# Patient Record
Sex: Female | Born: 1950 | Race: White | Hispanic: No | Marital: Married | Smoking: Never smoker
Health system: Southern US, Community
[De-identification: ages and names within clinical notes are randomized; demographics above are authoritative.]

## PROBLEM LIST (undated history)

## (undated) DIAGNOSIS — M1990 Unspecified osteoarthritis, unspecified site: Secondary | ICD-10-CM

## (undated) DIAGNOSIS — I011 Acute rheumatic endocarditis: Secondary | ICD-10-CM

## (undated) DIAGNOSIS — C801 Malignant (primary) neoplasm, unspecified: Secondary | ICD-10-CM

## (undated) DIAGNOSIS — E039 Hypothyroidism, unspecified: Secondary | ICD-10-CM

## (undated) HISTORY — PX: EYE SURGERY: SHX253

## (undated) HISTORY — PX: NO PAST SURGERIES: SHX2092

---

## 2019-12-01 ENCOUNTER — Other Ambulatory Visit: Payer: Self-pay | Admitting: Obstetrics and Gynecology

## 2019-12-02 ENCOUNTER — Other Ambulatory Visit: Payer: Self-pay | Admitting: Obstetrics and Gynecology

## 2019-12-02 DIAGNOSIS — Z803 Family history of malignant neoplasm of breast: Secondary | ICD-10-CM

## 2020-04-09 NOTE — H&P (Signed)
TOTAL KNEE ADMISSION H&P  Patient is being admitted for left total knee arthroplasty.  Subjective:  Chief Complaint:    Left knee OA / pain  HPI: Molly Wilson, 69 y.o. female, has a history of pain and functional disability in the left knee due to arthritis and has failed non-surgical conservative treatments for greater than 12 weeks to include NSAID's and/or analgesics, corticosteriod injections and activity modification.  Onset of symptoms was gradual, starting 3 years ago with gradually worsening course since that time. The patient noted no past surgery on the left knee(s).  Patient currently rates pain in the left knee(s) at 9 out of 10 with activity. Patient has night pain, worsening of pain with activity and weight bearing, pain that interferes with activities of daily living, pain with passive range of motion, crepitus and joint swelling.  Patient has evidence of periarticular osteophytes and joint space narrowing by imaging studies.  There is no active infection.  Risks, benefits and expectations were discussed with the patient.  Risks including but not limited to the risk of anesthesia, blood clots, nerve damage, blood vessel damage, failure of the prosthesis, infection and up to and including death.  Patient understand the risks, benefits and expectations and wishes to proceed with surgery.   PCP: Javier Glazier, MD  D/C Plans:       Home   Post-op Meds:       No Rx given  Tranexamic Acid:      To be given - IV   Decadron:      Is to be given  FYI:      ASA  Norco  DME:    Rx sent for - RW & 3-n-1  PT:   OPPT   Ph[facility]acy: [facility] -  [address], [place], [place]    Past Medical History:  Diagnosis Date   Arthritis    Cancer ([place])    colorectal cancer   Hypothyroidism    Rheumatoid aortitis     Past Surgical History:  Procedure Laterality Date   EYE SURGERY     cataract   NO PAST SURGERIES      No current facility-administered medications for this  encounter.   Current Outpatient Medications  Medication Sig Dispense Refill Last Dose   acetaminophen (TYLENOL) 500 MG tablet Take 500-1,000 mg by mouth every 6 (six) hours as needed (for pain.).      azelastine (ASTELIN) 0.1 % nasal spray Place 1 spray into both nostrils at bedtime. Use in each nostril as directed      Biotin 5000 MCG TABS Take 5,000 mcg by mouth daily.      Calcium Carbonate-Vit D-Min (CALCIUM 600+D3 PLUS MINERALS PO) Take 1 tablet by mouth daily with lunch.      Carboxymeth-Glycerin-Polysorb (REFRESH OPTIVE ADVANCED) 0.5-1-0.5 % SOLN Place 1 drop into both eyes daily.      diclofenac Sodium (VOLTAREN) 1 % GEL Apply 1 application topically 4 (four) times daily as needed (knee pain.).      Dietary Management Product (RHEUMATE) CAPS Take 1 capsule by mouth daily after lunch.       Fish Oil-Cholecalciferol (OMEGA-3 + VITAMIN D3 PO) Take 1 capsule by mouth daily.      fluticasone (FLONASE) 50 MCG/ACT nasal spray Place 2 sprays into both nostrils daily.       Ivermectin (SOOLANTRA) 1 % CREA Apply 1 application topically every other day as needed (skin irritation.).      Lactobacillus-Inulin (CULTURELLE DIGESTIVE DAILY  PO) Take 1 capsule by mouth daily.      levothyroxine (SYNTHROID) 100 MCG tablet Take 100 mcg by mouth daily before breakfast.      Menaquinone-7 (VITAMIN K2) 100 MCG CAPS Take 100 mcg by mouth daily.      Polyethyl Glycol-Propyl Glycol (SYSTANE) 0.4-0.3 % GEL ophthalmic gel Place 1 application into both eyes at bedtime.      predniSONE (DELTASONE) 1 MG tablet Take 2 mg by mouth daily. In the morning.      tretinoin (RETIN-A) 0.025 % cream Apply 1 application topically every other day as needed (skin irritation.). Alternate every other night with soolantra cream      No Known Allergies   Social History   Tobacco Use   Smoking status: Never Smoker   Smokeless tobacco: Never Used  Substance Use Topics   Alcohol use: Yes    Alcohol/week: 1.0 -  2.0 standard drink    Types: 1 - 2 Glasses of wine per week    Comment: week       Review of Systems  Constitutional: Negative.   HENT: Negative.   Eyes: Negative.   Respiratory: Negative.   Cardiovascular: Negative.   Gastrointestinal: Negative.   Genitourinary: Negative.   Musculoskeletal: Positive for joint pain.  Skin: Negative.   Neurological: Negative.   Endo/Heme/Allergies: Positive for environmental allergies.  Psychiatric/Behavioral: Negative.       Objective:  Physical Exam Constitutional:      Appearance: She is well-developed.  HENT:     Head: Normocephalic.  Eyes:     Pupils: Pupils are equal, round, and reactive to light.  Neck:     Thyroid: No thyromegaly.     Vascular: No JVD.     Trachea: No tracheal deviation.  Cardiovascular:     Rate and Rhythm: Normal rate and regular rhythm.  Pulmonary:     Effort: Pulmonary effort is normal. No respiratory distress.     Breath sounds: Normal breath sounds. No wheezing.  Abdominal:     Palpations: Abdomen is soft.     Tenderness: There is no abdominal tenderness. There is no guarding.  Musculoskeletal:     Cervical back: Neck supple.     Left knee: Swelling and bony tenderness present. No erythema or ecchymosis. Decreased range of motion. Tenderness present.  Lymphadenopathy:     Cervical: No cervical adenopathy.  Skin:    General: Skin is warm and dry.  Neurological:     Mental Status: She is alert and oriented to person, place, and time.       Imaging Review Plain radiographs demonstrate severe degenerative joint disease of the left knee.  The bone quality appears to be good for age and reported activity level.      Assessment/Plan:  End stage arthritis, left knee   The patient history, physical examination, clinical judgment of the provider and imaging studies are consistent with end stage degenerative joint disease of the left knee(s) and total knee arthroplasty is deemed medically  necessary. The treatment options including medical management, injection therapy arthroscopy and arthroplasty were discussed at length. The risks and benefits of total knee arthroplasty were presented and reviewed. The risks due to aseptic loosening, infection, stiffness, patella tracking problems, thromboembolic complications and other imponderables were discussed. The patient acknowledged the explanation, agreed to proceed with the plan and consent was signed. Patient is being admitted for inpatient treatment for surgery, pain control, PT, OT, prophylactic antibiotics, VTE prophylaxis, progressive ambulation and ADL's and discharge planning.  The patient is planning to be discharged home.     Patient's anticipated LOS is less than 2 midnights, meeting these requirements: - Lives within 1 hour of care - Has a competent adult at home to recover with post-op recover - NO history of  - Chronic pain requiring opiods  - Diabetes  - Coronary Artery Disease  - Heart failure  - Heart attack  - Stroke  - DVT/VTE  - Cardiac arrhythmia  - Respiratory Failure/COPD  - Renal failure  - Anemia  - Advanced Liver disease    [place] Pugh. Ilona Colley   PA-C  04/23/2020, 9:44 PM

## 2020-04-16 NOTE — Patient Instructions (Addendum)
DUE TO COVID-19 ONLY ONE VISITOR IS ALLOWED TO COME WITH YOU AND STAY IN THE WAITING ROOM ONLY DURING PRE OP AND PROCEDURE DAY OF SURGERY. THE 1 VISITOR MAY VISIT WITH YOU AFTER SURGERY IN YOUR PRIVATE ROOM DURING VISITING HOURS ONLY!  YOU NEED TO HAVE A COVID 19 TEST ON: 04/20/20 @ 2:50 pm , THIS TEST MUST BE DONE BEFORE SURGERY, COME  [address], [place] , [address].  ([place]) ONCE YOUR COVID TEST IS COMPLETED, PLEASE BEGIN THE QUARANTINE INSTRUCTIONS AS OUTLINED IN YOUR HANDOUT.                Ferrin Liebig   Your procedure is scheduled on: 04/24/20   Report to [facility] Main  Entrance   Report to admitting at : 7:30 AM     Call this number if you have problems the morning of surgery [phone]    Remember:   NO SOLID FOOD AFTER MIDNIGHT THE NIGHT PRIOR TO SURGERY. NOTHING BY MOUTH EXCEPT CLEAR LIQUIDS UNTIL: 7:00 am . PLEASE FINISH ENSURE DRINK PER SURGEON ORDER  WHICH NEEDS TO BE COMPLETED AT: 7:00 am.  CLEAR LIQUID DIET   Foods Allowed                                                                     Foods Excluded  Coffee and tea, regular and decaf                             liquids that you cannot  Plain Jell-O any favor except red or purple                                           see through such as: Fruit ices (not with fruit pulp)                                     milk, soups, orange juice  Iced Popsicles                                    All solid food Carbonated beverages, regular and diet                                    Cranberry, grape and apple juices Sports drinks like Gatorade Lightly seasoned clear broth or consume(fat free) Sugar, honey syrup  Sample Menu Breakfast                                Lunch                                     Supper Cranberry juice  Beef broth                            Chicken broth Jell-O                                     Grape juice                            Apple juice Coffee or tea                        Jell-O                                      Popsicle                                                Coffee or tea                        Coffee or tea  _____________________________________________________________________   BRUSH YOUR TEETH MORNING OF SURGERY AND RINSE YOUR MOUTH OUT, NO CHEWING GUM CANDY OR MINTS.     Take these medicines the morning of surgery with A SIP OF WATER: levothyroxine,prednisone.                                You may not have any metal on your body including hair pins and              piercings  Do not wear jewelry, make-up, lotions, powders or perfumes, deodorant             Do not wear nail polish on your fingernails.  Do not shave  48 hours prior to surgery.     Do not bring valuables to the hospital. [place].  Contacts, dentures or bridgework may not be worn into surgery.  Leave suitcase in the car. After surgery it may be brought to your room.     Patients discharged the day of surgery will not be allowed to drive home. IF YOU ARE HAVING SURGERY AND GOING HOME THE SAME DAY, YOU MUST HAVE AN ADULT TO DRIVE YOU HOME AND BE WITH YOU FOR 24 HOURS. YOU MAY GO HOME BY TAXI OR UBER OR ORTHERWISE, BUT AN ADULT MUST ACCOMPANY YOU HOME AND STAY WITH YOU FOR 24 HOURS.  Name and phone number of your driver:  Special Instructions: N/A              Please read over the following fact sheets you were given: _____________________________________________________________________         [facility] - Preparing for Surgery Before surgery, you can play an important role.  Because skin is not sterile, your skin needs to be as free of germs as possible.  You can reduce the number of germs on your skin by washing with CHG (chlorahexidine gluconate) soap before surgery.  CHG  is an antiseptic cleaner which kills germs and bonds with the skin to continue killing germs even after  washing. Please DO NOT use if you have an allergy to CHG or antibacterial soaps.  If your skin becomes reddened/irritated stop using the CHG and inform your nurse when you arrive at Short Stay. Do not shave (including legs and underarms) for at least 48 hours prior to the first CHG shower.  You may shave your face/neck. Please follow these instructions carefully:  1.  Shower with CHG Soap the night before surgery and the  morning of Surgery.  2.  If you choose to wash your hair, wash your hair first as usual with your  normal  shampoo.  3.  After you shampoo, rinse your hair and body thoroughly to remove the  shampoo.                           4.  Use CHG as you would any other liquid soap.  You can apply chg directly  to the skin and wash                       Gently with a scrungie or clean washcloth.  5.  Apply the CHG Soap to your body ONLY FROM THE NECK DOWN.   Do not use on face/ open                           Wound or open sores. Avoid contact with eyes, ears mouth and genitals (private parts).                       Wash face,  Genitals (private parts) with your normal soap.             6.  Wash thoroughly, paying special attention to the area where your surgery  will be performed.  7.  Thoroughly rinse your body with warm water from the neck down.  8.  DO NOT shower/wash with your normal soap after using and rinsing off  the CHG Soap.                9.  Pat yourself dry with a clean towel.            10.  Wear clean pajamas.            11.  Place clean sheets on your bed the night of your first shower and do not  sleep with pets. Day of Surgery : Do not apply any lotions/deodorants the morning of surgery.  Please wear clean clothes to the hospital/surgery center.  FAILURE TO FOLLOW THESE INSTRUCTIONS MAY RESULT IN THE CANCELLATION OF YOUR SURGERY PATIENT SIGNATURE_________________________________  NURSE  SIGNATURE__________________________________  ________________________________________________________________________   Adam Phenix  An incentive spirometer is a tool that can help keep your lungs clear and active. This tool measures how well you are filling your lungs with each breath. Taking long deep breaths may help reverse or decrease the chance of developing breathing (pulmonary) problems (especially infection) following:  A long period of time when you are unable to move or be active. BEFORE THE PROCEDURE   If the spirometer includes an indicator to show your best effort, your nurse or respiratory therapist will set it to a desired goal.  If possible, sit up straight or lean slightly forward. Try not to slouch.  Hold the incentive spirometer in an upright position. INSTRUCTIONS FOR USE  1. Sit on the edge of your bed if possible, or sit up as far as you can in bed or on a chair. 2. Hold the incentive spirometer in an upright position. 3. Breathe out normally. 4. Place the mouthpiece in your mouth and seal your lips tightly around it. 5. Breathe in slowly and as deeply as possible, raising the piston or the ball toward the top of the column. 6. Hold your breath for 3-5 seconds or for as long as possible. Allow the piston or ball to fall to the bottom of the column. 7. Remove the mouthpiece from your mouth and breathe out normally. 8. Rest for a few seconds and repeat Steps 1 through 7 at least 10 times every 1-2 hours when you are awake. Take your time and take a few normal breaths between deep breaths. 9. The spirometer may include an indicator to show your best effort. Use the indicator as a goal to work toward during each repetition. 10. After each set of 10 deep breaths, practice coughing to be sure your lungs are clear. If you have an incision (the cut made at the time of surgery), support your incision when coughing by placing a pillow or rolled up towels firmly  against it. Once you are able to get out of bed, walk around indoors and cough well. You may stop using the incentive spirometer when instructed by your caregiver.  RISKS AND COMPLICATIONS  Take your time so you do not get dizzy or light-headed.  If you are in pain, you may need to take or ask for pain medication before doing incentive spirometry. It is harder to take a deep breath if you are having pain. AFTER USE  Rest and breathe slowly and easily.  It can be helpful to keep track of a log of your progress. Your caregiver can provide you with a simple table to help with this. If you are using the spirometer at home, follow these instructions: [place] IF:   You are having difficultly using the spirometer.  You have trouble using the spirometer as often as instructed.  Your pain medication is not giving enough relief while using the spirometer.  You develop fever of 100.5 F (38.1 C) or higher. SEEK IMMEDIATE MEDICAL CARE IF:   You cough up bloody sputum that had not been present before.  You develop fever of 102 F (38.9 C) or greater.  You develop worsening pain at or near the incision site. MAKE SURE YOU:   Understand these instructions.  Will watch your condition.  Will get help right away if you are not doing well or get worse. Document Released: 01/26/2007 Document Revised: 12/08/2011 Document Reviewed: 03/29/2007 [facility] Patient Information 2014 [place], [place].   ________________________________________________________________________

## 2020-04-17 ENCOUNTER — Other Ambulatory Visit: Payer: Self-pay

## 2020-04-17 ENCOUNTER — Encounter (HOSPITAL_COMMUNITY): Payer: Self-pay

## 2020-04-17 ENCOUNTER — Encounter (HOSPITAL_COMMUNITY)
Admission: RE | Admit: 2020-04-17 | Discharge: 2020-04-17 | Disposition: A | Discharge status: Home / Self Care | Payer: Medicare Other | Source: Ambulatory Visit | Attending: Orthopedic Surgery | Admitting: Orthopedic Surgery

## 2020-04-17 DIAGNOSIS — Z01812 Encounter for preprocedural laboratory examination: Secondary | ICD-10-CM | POA: Diagnosis present

## 2020-04-17 HISTORY — DX: Hypothyroidism, unspecified: E03.9

## 2020-04-17 HISTORY — DX: Malignant (primary) neoplasm, unspecified: C80.1

## 2020-04-17 HISTORY — DX: Unspecified osteoarthritis, unspecified site: M19.90

## 2020-04-17 HISTORY — DX: Acute rheumatic endocarditis: I01.1

## 2020-04-17 LAB — SURGICAL PCR SCREEN
MRSA, PCR: NEGATIVE
Staphylococcus aureus: NEGATIVE

## 2020-04-17 LAB — CBC
HCT: 41.8 % (ref 36.0–46.0)
Hemoglobin: 13.3 g/dL (ref 12.0–15.0)
MCH: 31.4 pg (ref 26.0–34.0)
MCHC: 31.8 g/dL (ref 30.0–36.0)
MCV: 98.8 fL (ref 80.0–100.0)
Platelets: 235 10*3/uL (ref 150–400)
RBC: 4.23 MIL/uL (ref 3.87–5.11)
RDW: 13.4 % (ref 11.5–15.5)
WBC: 7.2 10*3/uL (ref 4.0–10.5)
nRBC: 0 % (ref 0.0–0.2)

## 2020-04-17 NOTE — Progress Notes (Signed)
COVID Vaccine Completed:yes Date COVID Vaccine completed:10/2019 COVID vaccine manufacturer: [place]   PCP - Dr. Merilynn Finland Cardiologist - No  Chest x-ray -  EKG -  Stress Test -  ECHO -  Cardiac Cath -   Sleep Study -  CPAP -   Fasting Blood Sugar -  Checks Blood Sugar _____ times a day  Blood Thinner Instructions: Aspirin Instructions: Last Dose:  Anesthesia review:   Patient denies shortness of breath, fever, cough and chest pain at PAT appointment   Patient verbalized understanding of instructions that were given to them at the PAT appointment. Patient was also instructed that they will need to review over the PAT instructions again at home before surgery.

## 2020-04-19 ENCOUNTER — Other Ambulatory Visit (HOSPITAL_COMMUNITY): Payer: Self-pay

## 2020-04-20 ENCOUNTER — Other Ambulatory Visit (HOSPITAL_COMMUNITY)
Admission: RE | Admit: 2020-04-20 | Discharge: 2020-04-20 | Disposition: A | Discharge status: Home / Self Care | Payer: Medicare Other | Source: Ambulatory Visit | Attending: Orthopedic Surgery | Admitting: Orthopedic Surgery

## 2020-04-20 DIAGNOSIS — Z20822 Contact with and (suspected) exposure to covid-19: Secondary | ICD-10-CM | POA: Insufficient documentation

## 2020-04-20 DIAGNOSIS — Z01812 Encounter for preprocedural laboratory examination: Secondary | ICD-10-CM | POA: Diagnosis present

## 2020-04-20 LAB — SARS CORONAVIRUS 2 (TAT 6-24 HRS): SARS Coronavirus 2: NEGATIVE

## 2020-04-24 ENCOUNTER — Other Ambulatory Visit: Payer: Self-pay

## 2020-04-24 ENCOUNTER — Observation Stay (HOSPITAL_COMMUNITY)
Admission: RE | Admit: 2020-04-24 | Discharge: 2020-04-25 | Disposition: A | Discharge status: Home / Self Care | Payer: Medicare Other | Attending: Orthopedic Surgery | Admitting: Orthopedic Surgery

## 2020-04-24 ENCOUNTER — Encounter (HOSPITAL_COMMUNITY): Payer: Self-pay | Admitting: Orthopedic Surgery

## 2020-04-24 ENCOUNTER — Ambulatory Visit (HOSPITAL_COMMUNITY): Payer: Medicare Other | Admitting: Anesthesiology

## 2020-04-24 ENCOUNTER — Encounter (HOSPITAL_COMMUNITY)
Admission: RE | Disposition: A | Discharge status: Home / Self Care | Payer: Self-pay | Source: Home / Self Care | Attending: Orthopedic Surgery

## 2020-04-24 DIAGNOSIS — Z85038 Personal history of other malignant neoplasm of large intestine: Secondary | ICD-10-CM | POA: Insufficient documentation

## 2020-04-24 DIAGNOSIS — M1712 Unilateral primary osteoarthritis, left knee: Secondary | ICD-10-CM | POA: Diagnosis not present

## 2020-04-24 DIAGNOSIS — E039 Hypothyroidism, unspecified: Secondary | ICD-10-CM | POA: Diagnosis not present

## 2020-04-24 DIAGNOSIS — M25562 Pain in left knee: Secondary | ICD-10-CM | POA: Diagnosis present

## 2020-04-24 DIAGNOSIS — Z96652 Presence of left artificial knee joint: Secondary | ICD-10-CM

## 2020-04-24 DIAGNOSIS — J302 Other seasonal allergic rhinitis: Secondary | ICD-10-CM | POA: Diagnosis not present

## 2020-04-24 HISTORY — PX: TOTAL KNEE ARTHROPLASTY: SHX125

## 2020-04-24 LAB — TYPE AND SCREEN
ABO/RH(D): O POS
Antibody Screen: NEGATIVE

## 2020-04-24 LAB — ABO/RH: ABO/RH(D): O POS

## 2020-04-24 SURGERY — ARTHROPLASTY, KNEE, TOTAL
Anesthesia: Spinal | Site: Knee | Laterality: Left

## 2020-04-24 MED ORDER — PHENYLEPHRINE 40 MCG/ML (10ML) SYRINGE FOR IV PUSH (FOR BLOOD PRESSURE SUPPORT)
PREFILLED_SYRINGE | INTRAVENOUS | Status: DC | PRN
  Administered 2020-04-24 (×2): 80 ug via INTRAVENOUS

## 2020-04-24 MED ORDER — ACETAMINOPHEN 325 MG PO TABS: 325.0000 mg | ORAL_TABLET | Freq: Four times a day (QID) | ORAL | Status: DC | PRN

## 2020-04-24 MED ORDER — FENTANYL CITRATE (PF) 100 MCG/2ML IJ SOLN
50.0000 ug | Freq: Once | INTRAMUSCULAR | Status: AC
  Administered 2020-04-24: 50 ug via INTRAVENOUS
  Filled 2020-04-24: qty 2

## 2020-04-24 MED ORDER — PROPOFOL 1000 MG/100ML IV EMUL
INTRAVENOUS | Status: AC
  Filled 2020-04-24: qty 100

## 2020-04-24 MED ORDER — LIDOCAINE 2% (20 MG/ML) 5 ML SYRINGE
INTRAMUSCULAR | Status: DC | PRN
  Administered 2020-04-24: 50 mg via INTRAVENOUS

## 2020-04-24 MED ORDER — HYDROCODONE-ACETAMINOPHEN 5-325 MG PO TABS
1.0000 | ORAL_TABLET | ORAL | Status: DC | PRN
  Administered 2020-04-24: 1 via ORAL
  Filled 2020-04-24: qty 1

## 2020-04-24 MED ORDER — PROPOFOL 10 MG/ML IV BOLUS
INTRAVENOUS | Status: DC | PRN
  Administered 2020-04-24: 20 mg via INTRAVENOUS
  Administered 2020-04-24: 65 ug/kg/min via INTRAVENOUS

## 2020-04-24 MED ORDER — SODIUM CHLORIDE (PF) 0.9 % IJ SOLN
INTRAMUSCULAR | Status: DC | PRN
  Administered 2020-04-24: 30 mL

## 2020-04-24 MED ORDER — METOCLOPRAMIDE HCL 5 MG PO TABS: 5.0000 mg | ORAL_TABLET | Freq: Three times a day (TID) | ORAL | Status: DC | PRN

## 2020-04-24 MED ORDER — TRANEXAMIC ACID-NACL 1000-0.7 MG/100ML-% IV SOLN
1000.0000 mg | Freq: Once | INTRAVENOUS | Status: AC
  Administered 2020-04-24: 1000 mg via INTRAVENOUS
  Filled 2020-04-24: qty 100

## 2020-04-24 MED ORDER — MENTHOL 3 MG MT LOZG: 1.0000 | LOZENGE | OROMUCOSAL | Status: DC | PRN

## 2020-04-24 MED ORDER — DEXAMETHASONE SODIUM PHOSPHATE 10 MG/ML IJ SOLN
10.0000 mg | Freq: Once | INTRAMUSCULAR | Status: AC
  Administered 2020-04-25: 10 mg via INTRAVENOUS
  Filled 2020-04-24: qty 1

## 2020-04-24 MED ORDER — ONDANSETRON HCL 4 MG PO TABS
4.0000 mg | ORAL_TABLET | Freq: Four times a day (QID) | ORAL | Status: DC | PRN
  Administered 2020-04-24 – 2020-04-25 (×3): 4 mg via ORAL
  Filled 2020-04-24 (×3): qty 1

## 2020-04-24 MED ORDER — LACTATED RINGERS IV SOLN: INTRAVENOUS | Status: DC

## 2020-04-24 MED ORDER — ONDANSETRON HCL 4 MG/2ML IJ SOLN: 4.0000 mg | Freq: Four times a day (QID) | INTRAMUSCULAR | Status: DC | PRN

## 2020-04-24 MED ORDER — ONDANSETRON HCL 4 MG/2ML IJ SOLN
INTRAMUSCULAR | Status: DC | PRN
  Administered 2020-04-24: 4 mg via INTRAVENOUS

## 2020-04-24 MED ORDER — SODIUM CHLORIDE 0.9 % IV SOLN: INTRAVENOUS | Status: DC

## 2020-04-24 MED ORDER — ALUM & MAG HYDROXIDE-SIMETH 200-200-20 MG/5ML PO SUSP: 15.0000 mL | ORAL | Status: DC | PRN

## 2020-04-24 MED ORDER — LEVOTHYROXINE SODIUM 100 MCG PO TABS
100.0000 ug | ORAL_TABLET | Freq: Every day | ORAL | Status: DC
  Administered 2020-04-25: 100 ug via ORAL
  Filled 2020-04-24: qty 1

## 2020-04-24 MED ORDER — KETOROLAC TROMETHAMINE 30 MG/ML IJ SOLN
INTRAMUSCULAR | Status: AC
  Filled 2020-04-24: qty 1

## 2020-04-24 MED ORDER — CELECOXIB 200 MG PO CAPS
200.0000 mg | ORAL_CAPSULE | Freq: Two times a day (BID) | ORAL | Status: DC
  Administered 2020-04-24 – 2020-04-25 (×2): 200 mg via ORAL
  Filled 2020-04-24 (×2): qty 1

## 2020-04-24 MED ORDER — CEFAZOLIN SODIUM-DEXTROSE 2-4 GM/100ML-% IV SOLN
2.0000 g | Freq: Four times a day (QID) | INTRAVENOUS | Status: AC
  Administered 2020-04-24 (×2): 2 g via INTRAVENOUS
  Filled 2020-04-24 (×2): qty 100

## 2020-04-24 MED ORDER — MIDAZOLAM HCL 2 MG/2ML IJ SOLN
1.0000 mg | INTRAMUSCULAR | Status: DC
  Administered 2020-04-24: 2 mg via INTRAVENOUS
  Filled 2020-04-24: qty 2

## 2020-04-24 MED ORDER — POLYETHYLENE GLYCOL 3350 17 G PO PACK
17.0000 g | PACK | Freq: Two times a day (BID) | ORAL | Status: DC
  Administered 2020-04-25: 17 g via ORAL
  Filled 2020-04-24: qty 1

## 2020-04-24 MED ORDER — BUPIVACAINE-EPINEPHRINE (PF) 0.25% -1:200000 IJ SOLN
INTRAMUSCULAR | Status: DC | PRN
  Administered 2020-04-24: 30 mL

## 2020-04-24 MED ORDER — BUPIVACAINE IN DEXTROSE 0.75-8.25 % IT SOLN
INTRATHECAL | Status: DC | PRN
  Administered 2020-04-24: 1.6 mL via INTRATHECAL

## 2020-04-24 MED ORDER — METHOCARBAMOL 500 MG PO TABS
500.0000 mg | ORAL_TABLET | Freq: Four times a day (QID) | ORAL | Status: DC | PRN
  Administered 2020-04-24 – 2020-04-25 (×3): 500 mg via ORAL
  Filled 2020-04-24 (×3): qty 1

## 2020-04-24 MED ORDER — DOCUSATE SODIUM 100 MG PO CAPS
100.0000 mg | ORAL_CAPSULE | Freq: Two times a day (BID) | ORAL | Status: DC
  Administered 2020-04-24 – 2020-04-25 (×2): 100 mg via ORAL
  Filled 2020-04-24 (×2): qty 1

## 2020-04-24 MED ORDER — PREDNISONE 1 MG PO TABS
2.0000 mg | ORAL_TABLET | Freq: Every day | ORAL | Status: DC
  Administered 2020-04-25: 2 mg via ORAL
  Filled 2020-04-24: qty 2

## 2020-04-24 MED ORDER — HYDROCODONE-ACETAMINOPHEN 7.5-325 MG PO TABS
1.0000 | ORAL_TABLET | ORAL | Status: DC | PRN
  Administered 2020-04-24: 2 via ORAL
  Administered 2020-04-25 (×4): 1 via ORAL
  Filled 2020-04-24: qty 2
  Filled 2020-04-24 (×4): qty 1

## 2020-04-24 MED ORDER — CEFAZOLIN SODIUM-DEXTROSE 2-4 GM/100ML-% IV SOLN
2.0000 g | INTRAVENOUS | Status: AC
  Administered 2020-04-24: 2 g via INTRAVENOUS
  Filled 2020-04-24: qty 100

## 2020-04-24 MED ORDER — STERILE WATER FOR IRRIGATION IR SOLN
Status: DC | PRN
  Administered 2020-04-24: 1000 mL

## 2020-04-24 MED ORDER — HYDROMORPHONE HCL 1 MG/ML IJ SOLN: 0.5000 mg | INTRAMUSCULAR | Status: DC | PRN

## 2020-04-24 MED ORDER — FLUTICASONE PROPIONATE 50 MCG/ACT NA SUSP
2.0000 | Freq: Every day | NASAL | Status: DC
  Filled 2020-04-24: qty 16

## 2020-04-24 MED ORDER — CHLORHEXIDINE GLUCONATE 0.12 % MT SOLN
15.0000 mL | Freq: Once | OROMUCOSAL | Status: AC
  Administered 2020-04-24: 15 mL via OROMUCOSAL

## 2020-04-24 MED ORDER — PHENOL 1.4 % MT LIQD: 1.0000 | OROMUCOSAL | Status: DC | PRN

## 2020-04-24 MED ORDER — FERROUS SULFATE 325 (65 FE) MG PO TABS
325.0000 mg | ORAL_TABLET | Freq: Two times a day (BID) | ORAL | Status: DC
  Administered 2020-04-25: 325 mg via ORAL
  Filled 2020-04-24: qty 1

## 2020-04-24 MED ORDER — PROPOFOL 10 MG/ML IV BOLUS
INTRAVENOUS | Status: AC
  Filled 2020-04-24: qty 20

## 2020-04-24 MED ORDER — BISACODYL 10 MG RE SUPP: 10.0000 mg | Freq: Every day | RECTAL | Status: DC | PRN

## 2020-04-24 MED ORDER — SODIUM CHLORIDE (PF) 0.9 % IJ SOLN
INTRAMUSCULAR | Status: AC
  Filled 2020-04-24: qty 100

## 2020-04-24 MED ORDER — DEXAMETHASONE SODIUM PHOSPHATE 10 MG/ML IJ SOLN
INTRAMUSCULAR | Status: DC | PRN
Start: 2020-04-24 — End: 2020-04-24
  Administered 2020-04-24: 10 mg

## 2020-04-24 MED ORDER — OXYCODONE HCL 5 MG PO TABS: 5.0000 mg | ORAL_TABLET | Freq: Once | ORAL | Status: DC | PRN

## 2020-04-24 MED ORDER — ONDANSETRON HCL 4 MG/2ML IJ SOLN: 4.0000 mg | Freq: Once | INTRAMUSCULAR | Status: DC | PRN

## 2020-04-24 MED ORDER — PHENYLEPHRINE HCL-NACL 10-0.9 MG/250ML-% IV SOLN
INTRAVENOUS | Status: DC | PRN
Start: 2020-04-24 — End: 2020-04-24
  Administered 2020-04-24: 25 ug/min via INTRAVENOUS

## 2020-04-24 MED ORDER — POVIDONE-IODINE 10 % EX SWAB
2.0000 "application " | Freq: Once | CUTANEOUS | Status: AC
  Administered 2020-04-24: 2 via TOPICAL

## 2020-04-24 MED ORDER — METHOCARBAMOL 500 MG IVPB - SIMPLE MED
500.0000 mg | Freq: Four times a day (QID) | INTRAVENOUS | Status: DC | PRN
  Filled 2020-04-24: qty 50

## 2020-04-24 MED ORDER — 0.9 % SODIUM CHLORIDE (POUR BTL) OPTIME
TOPICAL | Status: DC | PRN
  Administered 2020-04-24: 1000 mL

## 2020-04-24 MED ORDER — DEXAMETHASONE SODIUM PHOSPHATE 10 MG/ML IJ SOLN
10.0000 mg | Freq: Once | INTRAMUSCULAR | Status: AC
  Administered 2020-04-24: 8 mg via INTRAVENOUS

## 2020-04-24 MED ORDER — AZELASTINE HCL 0.1 % NA SOLN
1.0000 | Freq: Every day | NASAL | Status: DC
  Administered 2020-04-24: 1 via NASAL
  Filled 2020-04-24: qty 30

## 2020-04-24 MED ORDER — OXYCODONE HCL 5 MG/5ML PO SOLN: 5.0000 mg | Freq: Once | ORAL | Status: DC | PRN

## 2020-04-24 MED ORDER — SODIUM CHLORIDE 0.9 % IR SOLN
Status: DC | PRN
  Administered 2020-04-24: 1000 mL

## 2020-04-24 MED ORDER — ROPIVACAINE HCL 7.5 MG/ML IJ SOLN
INTRAMUSCULAR | Status: DC | PRN
  Administered 2020-04-24: 20 mL via PERINEURAL

## 2020-04-24 MED ORDER — MAGNESIUM CITRATE PO SOLN: 1.0000 | Freq: Once | ORAL | Status: DC | PRN

## 2020-04-24 MED ORDER — METOCLOPRAMIDE HCL 5 MG/ML IJ SOLN: 5.0000 mg | Freq: Three times a day (TID) | INTRAMUSCULAR | Status: DC | PRN

## 2020-04-24 MED ORDER — BUPIVACAINE-EPINEPHRINE (PF) 0.25% -1:200000 IJ SOLN
INTRAMUSCULAR | Status: AC
  Filled 2020-04-24: qty 30

## 2020-04-24 MED ORDER — TRANEXAMIC ACID-NACL 1000-0.7 MG/100ML-% IV SOLN
1000.0000 mg | INTRAVENOUS | Status: AC
  Administered 2020-04-24: 1000 mg via INTRAVENOUS
  Filled 2020-04-24: qty 100

## 2020-04-24 MED ORDER — HYDROMORPHONE HCL 1 MG/ML IJ SOLN: 0.2500 mg | INTRAMUSCULAR | Status: DC | PRN

## 2020-04-24 MED ORDER — ASPIRIN 81 MG PO CHEW
81.0000 mg | CHEWABLE_TABLET | Freq: Two times a day (BID) | ORAL | Status: DC
  Administered 2020-04-24 – 2020-04-25 (×2): 81 mg via ORAL
  Filled 2020-04-24 (×2): qty 1

## 2020-04-24 MED ORDER — DIPHENHYDRAMINE HCL 12.5 MG/5ML PO ELIX: 12.5000 mg | ORAL_SOLUTION | ORAL | Status: DC | PRN

## 2020-04-24 MED ORDER — ORAL CARE MOUTH RINSE: 15.0000 mL | Freq: Once | OROMUCOSAL | Status: AC

## 2020-04-24 MED ORDER — KETOROLAC TROMETHAMINE 30 MG/ML IJ SOLN
INTRAMUSCULAR | Status: DC | PRN
  Administered 2020-04-24: 30 mg

## 2020-04-24 SURGICAL SUPPLY — 63 items
ATTUNE MED ANAT PAT 35 KNEE (Knees) ×2 IMPLANT
ATTUNE MED ANAT PAT 35MM KNEE (Knees) ×1 IMPLANT
ATTUNE PSFEM LTSZ5 NARCEM KNEE (Femur) ×3 IMPLANT
ATTUNE PSRP INSR SZ5 6 KNEE (Insert) ×2 IMPLANT
ATTUNE PSRP INSR SZ5 6MM KNEE (Insert) ×1 IMPLANT
BAG ZIPLOCK 12X15 (MISCELLANEOUS) IMPLANT
BASEPLATE TIBIAL ROTATING SZ 4 (Knees) ×3 IMPLANT
BLADE SAW SGTL 11.0X1.19X90.0M (BLADE) IMPLANT
BLADE SAW SGTL 13.0X1.19X90.0M (BLADE) ×3 IMPLANT
BLADE SURG SZ10 CARB STEEL (BLADE) ×6 IMPLANT
BNDG ELASTIC 6X10 VLCR STRL LF (GAUZE/BANDAGES/DRESSINGS) ×3 IMPLANT
BNDG ELASTIC 6X5.8 VLCR STR LF (GAUZE/BANDAGES/DRESSINGS) ×3 IMPLANT
BOWL SMART MIX CTS (DISPOSABLE) ×3 IMPLANT
CEMENT HV SMART SET (Cement) ×6 IMPLANT
COVER SURGICAL LIGHT HANDLE (MISCELLANEOUS) ×3 IMPLANT
COVER WAND RF STERILE (DRAPES) IMPLANT
CUFF TOURN SGL QUICK 34 (TOURNIQUET CUFF) ×2
CUFF TRNQT CYL 34X4.125X (TOURNIQUET CUFF) ×1 IMPLANT
DECANTER SPIKE VIAL GLASS SM (MISCELLANEOUS) ×6 IMPLANT
DERMABOND ADVANCED (GAUZE/BANDAGES/DRESSINGS) ×2
DERMABOND ADVANCED .7 DNX12 (GAUZE/BANDAGES/DRESSINGS) ×1 IMPLANT
DRAPE U-SHAPE 47X51 STRL (DRAPES) ×3 IMPLANT
DRESSING AQUACEL AG SP 3.5X10 (GAUZE/BANDAGES/DRESSINGS) ×1 IMPLANT
DRSG AQUACEL AG ADV 3.5X10 (GAUZE/BANDAGES/DRESSINGS) ×3 IMPLANT
DRSG AQUACEL AG SP 3.5X10 (GAUZE/BANDAGES/DRESSINGS) ×3
DURAPREP 26ML APPLICATOR (WOUND CARE) ×6 IMPLANT
ELECT REM PT RETURN 15FT ADLT (MISCELLANEOUS) ×3 IMPLANT
GLOVE BIO SURGEON STRL SZ 6 (GLOVE) ×3 IMPLANT
GLOVE BIOGEL PI IND STRL 6.5 (GLOVE) ×1 IMPLANT
GLOVE BIOGEL PI IND STRL 7.5 (GLOVE) ×1 IMPLANT
GLOVE BIOGEL PI IND STRL 8.5 (GLOVE) ×1 IMPLANT
GLOVE BIOGEL PI INDICATOR 6.5 (GLOVE) ×2
GLOVE BIOGEL PI INDICATOR 7.5 (GLOVE) ×2
GLOVE BIOGEL PI INDICATOR 8.5 (GLOVE) ×2
GLOVE ECLIPSE 8.0 STRL XLNG CF (GLOVE) ×3 IMPLANT
GLOVE ORTHO TXT STRL SZ7.5 (GLOVE) ×3 IMPLANT
GOWN STRL REUS W/ TWL LRG LVL3 (GOWN DISPOSABLE) ×1 IMPLANT
GOWN STRL REUS W/TWL 2XL LVL3 (GOWN DISPOSABLE) ×3 IMPLANT
GOWN STRL REUS W/TWL LRG LVL3 (GOWN DISPOSABLE) ×5 IMPLANT
HANDPIECE INTERPULSE COAX TIP (DISPOSABLE) ×2
HOLDER FOLEY CATH W/STRAP (MISCELLANEOUS) IMPLANT
KIT TURNOVER KIT A (KITS) IMPLANT
MANIFOLD NEPTUNE II (INSTRUMENTS) ×3 IMPLANT
NDL SAFETY ECLIPSE 18X1.5 (NEEDLE) IMPLANT
NEEDLE HYPO 18GX1.5 SHARP (NEEDLE)
NS IRRIG 1000ML POUR BTL (IV SOLUTION) ×3 IMPLANT
PACK TOTAL KNEE CUSTOM (KITS) ×3 IMPLANT
PENCIL SMOKE EVACUATOR (MISCELLANEOUS) IMPLANT
PIN DRILL FIX HALF THREAD (BIT) ×3 IMPLANT
PIN FIX SIGMA LCS THRD HI (PIN) ×3 IMPLANT
PROTECTOR NERVE ULNAR (MISCELLANEOUS) ×3 IMPLANT
SET HNDPC FAN SPRY TIP SCT (DISPOSABLE) ×1 IMPLANT
SET PAD KNEE POSITIONER (MISCELLANEOUS) ×3 IMPLANT
SUT MNCRL AB 4-0 PS2 18 (SUTURE) ×3 IMPLANT
SUT STRATAFIX PDS+ 0 24IN (SUTURE) ×3 IMPLANT
SUT VIC AB 1 CT1 36 (SUTURE) ×3 IMPLANT
SUT VIC AB 2-0 CT1 27 (SUTURE) ×6
SUT VIC AB 2-0 CT1 TAPERPNT 27 (SUTURE) ×3 IMPLANT
SYR 3ML LL SCALE MARK (SYRINGE) ×3 IMPLANT
TRAY FOLEY MTR SLVR 16FR STAT (SET/KITS/TRAYS/PACK) ×3 IMPLANT
WATER STERILE IRR 1000ML POUR (IV SOLUTION) ×6 IMPLANT
WRAP KNEE MAXI GEL POST OP (GAUZE/BANDAGES/DRESSINGS) ×3 IMPLANT
YANKAUER SUCT BULB TIP 10FT TU (MISCELLANEOUS) ×3 IMPLANT

## 2020-04-24 NOTE — Anesthesia Procedure Notes (Signed)
Spinal  Patient location during procedure: OR Start time: 04/24/2020 10:03 AM End time: 04/24/2020 10:07 AM Staffing Performed: anesthesiologist  Anesthesiologist: Josephine Igo, MD Preanesthetic Checklist Completed: patient identified, IV checked, site marked, risks and benefits discussed, surgical consent, monitors and equipment checked, pre-op evaluation and timeout performed Spinal Block Patient position: sitting Prep: DuraPrep and site prepped and draped Patient monitoring: heart rate, cardiac monitor, continuous pulse ox and blood pressure Approach: midline Location: L3-4 Injection technique: single-shot Needle Needle type: Pencan  Needle gauge: 24 G Needle length: 9 cm Needle insertion depth: 5 cm Assessment Sensory level: T4 Additional Notes Patient tolerated procedure well. Adequate sensory level.

## 2020-04-24 NOTE — Op Note (Signed)
NAME:  Molly Wilson RECORD NO.:  [id number]                             FACILITY:  [facility]      PHYSICIAN:  Pietro Cassis. Alvan Dame, M.D.  DATE OF BIRTH:  1951/08/11      DATE OF PROCEDURE:  04/24/2020                                     OPERATIVE REPORT         PREOPERATIVE DIAGNOSIS:  Left knee osteoarthritis.      POSTOPERATIVE DIAGNOSIS:  Left knee osteoarthritis.      FINDINGS:  The patient was noted to have complete loss of cartilage and   bone-on-bone arthritis with associated osteophytes in the medial and patellofemoral compartments of   the knee with a significant synovitis and associated effusion.  The patient had failed months of conservative treatment including medications, injection therapy, activity modification.     PROCEDURE:  Left total knee replacement.      COMPONENTS USED:  DePuy Attune rotating platform posterior stabilized knee   system, a size 5N femur, 4 tibia, size 6 mm PS AOX insert, and 35 anatomic patellar   button.      SURGEON:  Pietro Cassis. Alvan Dame, M.D.      ASSISTANT:  Griffith Citron, PA-C.      ANESTHESIA:  Regional and Spinal.      SPECIMENS:  None.      COMPLICATION:  None.      DRAINS:  None.  EBL: <100 cc      TOURNIQUET TIME:   Total Tourniquet Time Documented: Thigh (Left) - 31 minutes Total: Thigh (Left) - 31 minutes  .      The patient was stable to the recovery room.      INDICATION FOR PROCEDURE:  Molly Wilson is a 69 y.o. female patient of   mine.  The patient had been seen, evaluated, and treated for months conservatively in the   office with medication, activity modification, and injections.  The patient had   radiographic changes of bone-on-bone arthritis with endplate sclerosis and osteophytes noted.  Based on the radiographic changes and failed conservative measures, the patient   decided to proceed with definitive treatment, total knee replacement.  Risks of infection, DVT, component failure, need  for revision surgery, neurovascular injury were reviewed in the office setting.  The postop course was reviewed stressing the efforts to maximize post-operative satisfaction and function.  Consent was obtained for benefit of pain   relief.      PROCEDURE IN DETAIL:  The patient was brought to the operative theater.   Once adequate anesthesia, preoperative antibiotics, 2 gm of Ancef,1 gm of Tranexamic Acid, and 10 mg of Decadron administered, the patient was positioned supine with a left thigh tourniquet placed.  The  left lower extremity was prepped and draped in sterile fashion.  A time-   out was performed identifying the patient, planned procedure, and the appropriate extremity.      The left lower extremity was placed in the [facility] leg holder.  The leg was   exsanguinated, tourniquet elevated to 250 mmHg.  A midline incision was  made followed by median parapatellar arthrotomy.  Following initial   exposure, attention was first directed to the patella.  Precut   measurement was noted to be 21 mm.  I resected down to 14 mm and used a   35 anatomic patellar button to restore patellar height as well as cover the cut surface.      The lug holes were drilled and a metal shim was placed to protect the   patella from retractors and saw blade during the procedure.      At this point, attention was now directed to the femur.  The femoral   canal was opened with a drill, irrigated to try to prevent fat emboli.  An   intramedullary rod was passed at 3 degrees valgus, 9 mm of bone was   resected off the distal femur.  Following this resection, the tibia was   subluxated anteriorly.  Using the extramedullary guide, 2 mm of bone was resected off   the proximal medial tibia.  We confirmed the gap would be   stable medially and laterally with a size 5 spacer block as well as confirmed that the tibial cut was perpendicular in the coronal plane, checking with an alignment rod.      Once this was done, I  sized the femur to be a size 5 in the anterior-   posterior dimension, chose a narrow component based on medial and   lateral dimension.  The size 5 rotation block was then pinned in   position anterior referenced using the C-clamp to set rotation.  The   anterior, posterior, and  chamfer cuts were made without difficulty nor   notching making certain that I was along the anterior cortex to help   with flexion gap stability.      The final box cut was made off the lateral aspect of distal femur.      At this point, the tibia was sized to be a size 4.  The size 4 tray was   then pinned in position through the medial third of the tubercle,   drilled, and keel punched.  Trial reduction was now carried with a 5 femur,  4 tibia, a size 6 mm PS insert, and the 35 anatomic patella botton.  The knee was brought to full extension with good flexion stability with the patella   tracking through the trochlea without application of pressure.  Given   all these findings the trial components removed.  Final components were   opened and cement was mixed.  The knee was irrigated with normal saline solution and pulse lavage.  The synovial lining was   then injected with 30 cc of 0.25% Marcaine with epinephrine, 1 cc of Toradol and 30 cc of NS for a total of 61 cc.     Final implants were then cemented onto cleaned and dried cut surfaces of bone with the knee brought to extension with a size 6 mm PS trial insert.      Once the cement had fully cured, excess cement was removed   throughout the knee.  I confirmed that I was satisfied with the range of   motion and stability, and the final size 6 mm PS AOX insert was chosen.  It was   placed into the knee.      The tourniquet had been let down at 31 minutes.  No significant   hemostasis was required.  The extensor mechanism was then reapproximated using #1 Vicryl  and #1 Stratafix sutures with the knee   in flexion.  The   remaining wound was closed with 2-0  Vicryl and running 4-0 Monocryl.   The knee was cleaned, dried, dressed sterilely using Dermabond and   Aquacel dressing.  The patient was then   brought to recovery room in stable condition, tolerating the procedure   well.   Please note that Physician Assistant, Griffith Citron, PA-C was present for the entirety of the case, and was utilized for pre-operative positioning, peri-operative retractor management, general facilitation of the procedure and for primary wound closure at the end of the case.              Pietro Cassis Alvan Dame, M.D.    04/24/2020 11:24 AM

## 2020-04-24 NOTE — Progress Notes (Signed)
AssistedDr. Foster with left, ultrasound guided, adductor canal block. Side rails up, monitors on throughout procedure. See vital signs in flow sheet. Tolerated Procedure well.  

## 2020-04-24 NOTE — Interval H&P Note (Signed)
History and Physical Interval Note:  04/24/2020 8:41 AM  Molly Wilson  has presented today for surgery, with the diagnosis of Left knee osteoarthritis.  The various methods of treatment have been discussed with the patient and family. After consideration of risks, benefits and other options for treatment, the patient has consented to  Procedure(s) with comments: TOTAL KNEE ARTHROPLASTY (Left) - 70 mins as a surgical intervention.  The patient's history has been reviewed, patient examined, no change in status, stable for surgery.  I have reviewed the patient's chart and labs.  Questions were answered to the patient's satisfaction.     Mauri Pole

## 2020-04-24 NOTE — Anesthesia Preprocedure Evaluation (Signed)
Anesthesia Evaluation  Patient identified by MRN, date of birth, ID band Patient awake    Reviewed: Allergy & Precautions, NPO status , Patient's Chart, lab work & pertinent test results  Airway Mallampati: II  TM Distance: >3 FB Neck ROM: Full    Dental  (+) Teeth Intact, Caps, Dental Advisory Given   Pulmonary neg pulmonary ROS,    Pulmonary exam normal breath sounds clear to auscultation       Cardiovascular Normal cardiovascular exam Rhythm:Regular Rate:Normal  Hx/o Rheumatoid aortitis   Neuro/Psych negative neurological ROS  negative psych ROS   GI/Hepatic negative GI ROS, Neg liver ROS,   Endo/Other  Hypothyroidism   Renal/GU   negative genitourinary   Musculoskeletal  (+) Arthritis , Osteoarthritis,  OA left knee   Abdominal   Peds  Hematology negative hematology ROS (+)   Anesthesia Other Findings   Reproductive/Obstetrics negative OB ROS                             Anesthesia Physical Anesthesia Plan  ASA: II  Anesthesia Plan: Spinal   Post-op Pain Management:  Regional for Post-op pain   Induction: Intravenous  PONV Risk Score and Plan: 3 and Ondansetron, Dexamethasone, Treatment may vary due to age or medical condition and Propofol infusion  Airway Management Planned: Natural Airway and Simple Face Mask  Additional Equipment:   Intra-op Plan:   Post-operative Plan:   Informed Consent: I have reviewed the patients History and Physical, chart, labs and discussed the procedure including the risks, benefits and alternatives for the proposed anesthesia with the patient or authorized representative who has indicated his/her understanding and acceptance.     Dental advisory given  Plan Discussed with: CRNA, Anesthesiologist and Surgeon  Anesthesia Plan Comments:         Anesthesia Quick Evaluation

## 2020-04-24 NOTE — Anesthesia Procedure Notes (Signed)
Anesthesia Regional Block: Adductor canal block   Pre-Anesthetic Checklist: ,, timeout performed, Correct Patient, Correct Site, Correct Laterality, Correct Procedure, Correct Position, site marked, Risks and benefits discussed,  Surgical consent,  Pre-op evaluation,  At surgeon's request and post-op pain management  Laterality: Left  Prep: chloraprep       Needles:  Injection technique: Single-shot  Needle Type: Echogenic Stimulator Needle     Needle Length: 9cm  Needle Gauge: 21   Needle insertion depth: 7 cm   Additional Needles:   Procedures:,,,, ultrasound used (permanent image in chart),,,,  Narrative:  Start time: 04/24/2020 9:15 AM End time: 04/24/2020 9:19 AM Injection made incrementally with aspirations every 5 mL.  Performed by: Personally  Anesthesiologist: Josephine Igo, MD  Additional Notes: Timeout performed. Patient sedated. Relevant anatomy ID'd using [place]. Incremental 2-58ml injection of LA with frequent aspiration. Patient tolerated procedure well.        Left Adductor Canal Block

## 2020-04-24 NOTE — Transfer of Care (Signed)
Immediate Anesthesia Transfer of Care Note  Patient: Molly Wilson  Procedure(s) Performed: Procedure(s) with comments: TOTAL KNEE ARTHROPLASTY (Left) - 70 mins  Patient Location: PACU  Anesthesia Type:Spinal  Level of Consciousness:  sedated, patient cooperative and responds to stimulation  Airway & Oxygen Therapy:Patient Spontanous Breathing and Patient connected to face mask oxgen  Post-op Assessment:  Report given to PACU RN and Post -op Vital signs reviewed and stable  Post vital signs:  Reviewed and stable  Last Vitals:  Vitals:   04/24/20 0919 04/24/20 0920  BP: 126/82   Pulse: 72 71  Resp: 16 16  Temp:    SpO2: 59% 16%    Complications: No apparent anesthesia complications

## 2020-04-24 NOTE — Plan of Care (Signed)
  Problem: Clinical Measurements: Goal: Postoperative complications will be avoided or minimized Outcome: Progressing   Problem: Pain Management: Goal: Pain level will decrease with appropriate interventions Outcome: Progressing   Problem: Skin Integrity: Goal: Will show signs of wound healing Outcome: Progressing   Problem: Activity: Goal: Ability to avoid complications of mobility impairment will improve Outcome: Progressing   Problem: Clinical Measurements: Goal: Will remain free from infection Outcome: Progressing   Problem: Nutrition: Goal: Adequate nutrition will be maintained Outcome: Progressing   Problem: Coping: Goal: Level of anxiety will decrease Outcome: Progressing

## 2020-04-24 NOTE — Discharge Instructions (Signed)

## 2020-04-24 NOTE — Anesthesia Postprocedure Evaluation (Signed)
Anesthesia Post Note  Patient: Molly Wilson  Procedure(s) Performed: TOTAL KNEE ARTHROPLASTY (Left Knee)     Patient location during evaluation: PACU Anesthesia Type: Spinal Level of consciousness: oriented and awake and alert Pain management: pain level controlled Vital Signs Assessment: post-procedure vital signs reviewed and stable Respiratory status: spontaneous breathing, respiratory function stable and nonlabored ventilation Cardiovascular status: blood pressure returned to baseline and stable Postop Assessment: no headache, no backache, no apparent nausea or vomiting, spinal receding and patient able to bend at knees Anesthetic complications: no   No complications documented.  Last Vitals:  Vitals:   04/24/20 1300 04/24/20 1315  BP: 112/76 119/79  Pulse: 60 58  Resp: 20 13  Temp:  36.9 C  SpO2: 100% 100%    Last Pain:  Vitals:   04/24/20 1315  TempSrc:   PainSc: 0-No pain                 Shirley Decamp A.

## 2020-04-24 NOTE — Evaluation (Signed)
Physical Therapy Evaluation Patient Details Name: Molly Wilson MRN: [id number] DOB: 27-Dec-1950 Today's Date: 04/24/2020   History of Present Illness  Patient is 69 y.o. female s/p Lt TKA on 04/24/20 with PMH significant for RA, OA, colorectal cancer, hypothyroidism.  Clinical Impression  IVAN MASKELL is a 69 y.o. female POD 0 s/p Lt TKA. Patient reports independence with mobility at baseline. Patient is now limited by functional impairments (see PT problem list below) and requires min assist for transfers and gait with RW. Patient was able to ambulate ~40 feet with RW and min assist. Patient instructed in exercise to facilitate ROM and circulation. Patient will benefit from continued skilled PT interventions to address impairments and progress towards PLOF. Acute PT will follow to progress mobility and stair training in preparation for safe discharge home.     Follow Up Recommendations Follow surgeon's recommendation for DC plan and follow-up therapies;Home health PT;Outpatient PT (PT at [facility])    Equipment Recommendations  Rolling walker with 5" wheels;3in1 (PT)    Recommendations for Other Services       Precautions / Restrictions Precautions Precautions: Fall Restrictions Weight Bearing Restrictions: No      Mobility  Bed Mobility Overal bed mobility: Needs Assistance Bed Mobility: Supine to Sit     Supine to sit: Min assist;HOB elevated     General bed mobility comments: cues to use bed rail, light assist to raise trunk fully  Transfers Overall transfer level: Needs assistance Equipment used: Rolling walker (2 wheeled) Transfers: Sit to/from Stand Sit to Stand: Min assist         General transfer comment: VC's for safe technique with RW, assist for power up. pt steady with rising.  Ambulation/Gait Ambulation/Gait assistance: Min assist;Min guard Gait Distance (Feet): 40 Feet Assistive device: Rolling walker (2 wheeled) Gait Pattern/deviations: Step-to  pattern;Decreased stride length;Decreased weight shift to left Gait velocity: decreased   General Gait Details: VC's for safe step pattern and proximity to RW, no overt LOB noted.   Stairs       Wheelchair Mobility    Modified Rankin (Stroke Patients Only)       Balance Overall balance assessment: Needs assistance Sitting-balance support: Feet supported Sitting balance-Leahy Scale: Good     Standing balance support: During functional activity;Bilateral upper extremity supported Standing balance-Leahy Scale: Fair              Pertinent Vitals/Pain Pain Assessment: 0-10 Pain Score: 4  Pain Location: Lt knee Pain Descriptors / Indicators: Burning;Aching;Discomfort Pain Intervention(s): Limited activity within patient's tolerance;Monitored during session;Repositioned;Ice applied    Home Living Family/patient expects to be discharged to:: Private residence Living Arrangements: Spouse/significant other Available Help at Discharge: Family Type of Home: House Home Access: Level entry     Home Layout: One [place]: [place] - single point;Grab bars - tub/shower Additional Comments: lives [facility] [address] in [place].     Prior Function Level of Independence: Independent               Hand Dominance   Dominant Hand: Right    Extremity/Trunk Assessment   Upper Extremity Assessment Upper Extremity Assessment: Overall WFL for tasks assessed    Lower Extremity Assessment Lower Extremity Assessment: Overall WFL for tasks assessed;LLE deficits/detail LLE Deficits / Details: good quad activation, no extensor lag with SLR LLE Sensation: WNL LLE Coordination: WNL    Cervical / Trunk Assessment Cervical / Trunk Assessment: Normal  Communication   Communication: No difficulties  Cognition Arousal/Alertness:  Awake/alert Behavior During Therapy: WFL for tasks assessed/performed Overall Cognitive Status: Within Functional Limits for tasks assessed                    General Comments      Exercises Total Joint Exercises Ankle Circles/Pumps: Both;AROM;10 reps;Seated Quad Sets: Left;5 reps;Seated;AROM Heel Slides: AROM;Left;5 reps;Seated   Assessment/Plan    PT Assessment Patient needs continued PT services  PT Problem List Decreased strength;Decreased range of motion;Decreased activity tolerance;Decreased balance;Decreased mobility;Decreased knowledge of use of DME       PT Treatment Interventions DME instruction;Gait training;Stair training;Therapeutic activities;Functional mobility training;Therapeutic exercise;Balance training;Patient/family education    PT Goals (Current goals can be found in the Care Plan section)  Acute Rehab PT Goals Patient Stated Goal: get back to walking her dogs PT Goal Formulation: With patient Time For Goal Achievement: 05/01/20 Potential to Achieve Goals: Good    Frequency 7X/week        AM-PAC PT "6 Clicks" Mobility  Outcome Measure Help needed turning from your back to your side while in a flat bed without using bedrails?: A Little Help needed moving from lying on your back to sitting on the side of a flat bed without using bedrails?: A Little Help needed moving to and from a bed to a chair (including a wheelchair)?: A Little Help needed standing up from a chair using your arms (e.g., wheelchair or bedside chair)?: A Little Help needed to walk in hospital room?: A Little Help needed climbing 3-5 steps with a railing? : A Little 6 Click Score: 18    End of Session Equipment Utilized During Treatment: Gait belt Activity Tolerance: Patient tolerated treatment well Patient left: in chair;with call bell/phone within reach;with chair alarm set;with family/visitor present;with nursing/sitter in room Nurse Communication: Mobility status;Patient requests pain meds PT Visit Diagnosis: Muscle weakness (generalized) (M62.81);Difficulty in walking, not elsewhere classified (R26.2)    Time:  4196-2229 PT Time Calculation (min) (ACUTE ONLY): 23 min   Charges:   PT Evaluation $PT Eval Low Complexity: 1 Low PT Treatments $Gait Training: 8-22 mins        Verner Mould, DPT Acute Rehabilitation Services  Office [phone] Pager [phone]  04/24/2020 4:32 PM

## 2020-04-25 DIAGNOSIS — M1712 Unilateral primary osteoarthritis, left knee: Secondary | ICD-10-CM | POA: Diagnosis not present

## 2020-04-25 LAB — BASIC METABOLIC PANEL
Anion gap: 7 (ref 5–15)
BUN: 15 mg/dL (ref 8–23)
CO2: 24 mmol/L (ref 22–32)
Calcium: 8.5 mg/dL — ABNORMAL LOW (ref 8.9–10.3)
Chloride: 107 mmol/L (ref 98–111)
Creatinine, Ser: 0.59 mg/dL (ref 0.44–1.00)
GFR calc Af Amer: >60 mL/min (ref >60)
GFR calc non Af Amer: >60 mL/min (ref >60)
Glucose, Bld: 174 mg/dL — ABNORMAL HIGH (ref 70–99)
Potassium: 3.8 mmol/L (ref 3.5–5.1)
Sodium: 138 mmol/L (ref 135–145)

## 2020-04-25 LAB — CBC
HCT: 34.3 % — ABNORMAL LOW (ref 36.0–46.0)
Hemoglobin: 10.8 g/dL — ABNORMAL LOW (ref 12.0–15.0)
MCH: 31.1 pg (ref 26.0–34.0)
MCHC: 31.5 g/dL (ref 30.0–36.0)
MCV: 98.8 fL (ref 80.0–100.0)
Platelets: 213 10*3/uL (ref 150–400)
RBC: 3.47 MIL/uL — ABNORMAL LOW (ref 3.87–5.11)
RDW: 13.2 % (ref 11.5–15.5)
WBC: 15 10*3/uL — ABNORMAL HIGH (ref 4.0–10.5)
nRBC: 0 % (ref 0.0–0.2)

## 2020-04-25 MED ORDER — ONDANSETRON HCL 4 MG PO TABS: 4.0000 mg | ORAL_TABLET | Freq: Three times a day (TID) | ORAL | 0 refills | Status: AC | PRN

## 2020-04-25 MED ORDER — POLYETHYLENE GLYCOL 3350 17 G PO PACK: 17.0000 g | PACK | Freq: Two times a day (BID) | ORAL | 0 refills | Status: AC

## 2020-04-25 MED ORDER — FERROUS SULFATE 325 (65 FE) MG PO TABS: 325.0000 mg | ORAL_TABLET | Freq: Three times a day (TID) | ORAL | 0 refills | Status: AC

## 2020-04-25 MED ORDER — ASPIRIN 81 MG PO CHEW: 81.0000 mg | CHEWABLE_TABLET | Freq: Two times a day (BID) | ORAL | 0 refills | Status: AC

## 2020-04-25 MED ORDER — HYDROCODONE-ACETAMINOPHEN 7.5-325 MG PO TABS: 1.0000 | ORAL_TABLET | ORAL | 0 refills | Status: AC | PRN

## 2020-04-25 MED ORDER — METHOCARBAMOL 500 MG PO TABS: 500.0000 mg | ORAL_TABLET | Freq: Four times a day (QID) | ORAL | 0 refills | Status: AC | PRN

## 2020-04-25 MED ORDER — DOCUSATE SODIUM 100 MG PO CAPS
100.0000 mg | ORAL_CAPSULE | Freq: Two times a day (BID) | ORAL | 0 refills | Status: AC
Start: 2020-04-25 — End: ?

## 2020-04-25 NOTE — TOC Transition Note (Signed)
Transition of Care [facility]) - CM/SW Discharge Note   Patient Details  Name: Molly Wilson MRN: [id number] Date of Birth: [birth year]  Transition of Care [facility]) CM/SW Contact:  Lia Hopping, LCSW Phone Number: 04/25/2020, 10:53 AM   Clinical Narrative:    Therapy Plan: OPPT [employer] delivered RW and 3 in1 to pt. Bedside.    Final next level of care: OP Rehab Barriers to Discharge: No Barriers Identified   Patient Goals and CMS Choice Patient states their goals for this hospitalization and ongoing recovery are:: to rehab   Choice offered to / list presented to : NA  Discharge Placement                       Discharge Plan and Services                DME Arranged: 3-N-1, Walker rolling DME Agency: [employer] Date DME Agency Contacted: 04/25/20 Time DME Agency Contacted: 0900 Representative spoke with at DME Agency: [place]: NA        Social Determinants of Health ([place]) Interventions     Readmission Risk Interventions No flowsheet data found.

## 2020-04-25 NOTE — Progress Notes (Signed)
     Subjective: 1 Day Post-Op Procedure(s) (LRB): TOTAL KNEE ARTHROPLASTY (Left)   Patient reports pain as mild, pain controlled. No reported events throughout the night.  Dr. Alvan Dame discussed the procedure, findings and expectations moving forward.  Ready to be discharged home, if they do well with PT.  Follow up in the clinic in 2 weeks.  Knows to call with any questions or concerns.     Patient's anticipated LOS is less than 2 midnights, meeting these requirements: - Lives within 1 hour of care - Has a competent adult at home to recover with post-op recover - NO history of  - Chronic pain requiring opiods  - Diabetes  - Coronary Artery Disease  - Heart failure  - Heart attack  - Stroke  - DVT/VTE  - Cardiac arrhythmia  - Respiratory Failure/COPD  - Renal failure  - Anemia  - Advanced Liver disease     Objective:   VITALS:   Vitals:   04/25/20 0154 04/25/20 0521  BP: 110/76 110/78  Pulse: 60 62  Resp: 16 16  Temp: (!) 97.5 F (36.4 C) (!) 97.4 F (36.3 C)  SpO2: 97% 97%    Dorsiflexion/Plantar flexion intact Incision: dressing C/D/I No cellulitis present Compartment soft  LABS Recent Labs    04/25/20 0306  HGB 10.8*  HCT 34.3*  WBC 15.0*  PLT 213    Recent Labs    04/25/20 0306  NA 138  K 3.8  BUN 15  CREATININE 0.59  GLUCOSE 174*     Assessment/Plan: 1 Day Post-Op Procedure(s) (LRB): TOTAL KNEE ARTHROPLASTY (Left) Foley cath d/c'ed Advance diet Up with therapy D/C IV fluids Discharge home Follow [facility] in 2 [facility] Follow up with OLIN,Iyanla Eilers D in 2 weeks.  Contact information:  [facility] [address], [address] [place] [phone]        Danae Orleans PA-C  [facility]  [place] [address] [place], [place], [place] [address] Phone: [phone] [email or web address] Facebook  [employer]

## 2020-04-25 NOTE — Progress Notes (Signed)
Physical Therapy Treatment Patient Details Name: Molly Wilson MRN: [id number] DOB: [birth year] Today's Date: 04/25/2020    History of Present Illness Patient is 69 y.o. female s/p Lt TKA on 04/24/20 with PMH significant for RA, OA, colorectal cancer, hypothyroidism.    PT Comments    POD # 1 am session Spouse present during session.  Assisted OOB.  General bed mobility comments: demonstrated and instructed how to use a belt to self assist LE.  Assisted with transfers and gait.  General transfer comment: 25% VC's on proper hand placement and safety with turns.  General Gait Details: 25% VC's on proper walker to self distance and proper sequencing.   Spouse also instructed on safe handling.  Then returned to room to perform some TE's following HEP handout.  Instructed on proper tech, freq as well as use of ICE.   Pt will need another PT session to complete HEP and ensure safety with mobility.   Follow Up Recommendations  Follow surgeons recommendation for DC plan and follow-up therapies;Home health PT;Outpatient PT     Equipment Recommendations  Rolling walker with 5" wheels;3in1 (PT)    Recommendations for Other Services       Precautions / Restrictions Precautions Precautions: Fall Precaution Comments: instructed no pillow under knee Restrictions Weight Bearing Restrictions: No Other Position/Activity Restrictions: WBAT    Mobility  Bed Mobility Overal bed mobility: Needs Assistance Bed Mobility: Supine to Sit     Supine to sit: Supervision;Min guard     General bed mobility comments: demonstrated and instructed how to use a belt to self assist LE  Transfers Overall transfer level: Needs assistance Equipment used: Rolling walker (2 wheeled) Transfers: Sit to/from [facility] Sit to Stand: Supervision Stand pivot transfers: Supervision;Min guard       General transfer comment: 25% VC's on proper hand placement and safety with  turns  Ambulation/Gait Ambulation/Gait assistance: Min guard;Min assist Gait Distance (Feet): 45 Feet Assistive device: Rolling walker (2 wheeled) Gait Pattern/deviations: Step-to pattern;Decreased stride length;Decreased weight shift to left Gait velocity: decreased   General Gait Details: 25% VC's on proper walker to self distance and proper sequencing.   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 Replacement TE's following HEP handout 10 reps B LE ankle pumps 05 reps towel squeezes 05 reps knee presses 05 reps heel slides  05 reps SAQ's 05 reps SLR's 05 reps ABD Educated on use of gait belt to assist with TE's Followed by ICE     General Comments        Pertinent Vitals/Pain Pain Assessment: 0-10 Pain Score: 3  Pain Location: Lt knee Pain Descriptors / Indicators: Burning;Aching;Discomfort Pain Intervention(s): Monitored  during session;Premedicated before session;Repositioned;Ice applied    Home Living                      Prior Function            PT Goals (current goals can now be found in the care plan section) Progress towards PT goals: Progressing toward goals    Frequency    7X/week      PT Plan Current plan remains appropriate    Co-evaluation              AM-PAC PT "6 Clicks" Mobility   Outcome Measure  Help needed turning from your back to your side while in a flat bed without using bedrails?: None Help needed moving from lying on your back to sitting on the  side of a flat bed without using bedrails?: A Little Help needed moving to and from a bed to a chair (including a wheelchair)?: A Little Help needed standing up from a chair using your arms (e.g., wheelchair or bedside chair)?: A Little Help needed to walk in hospital room?: A Little Help needed climbing 3-5 steps with a railing? : A Little 6 Click Score: 19    End of Session Equipment Utilized During Treatment: Gait belt Activity Tolerance: Patient tolerated treatment well Patient left: in chair;with call bell/phone within reach;with chair alarm set;with family/visitor present Nurse Communication: Mobility status (pt will need a second PT session to complete HEP education) PT Visit Diagnosis: Muscle weakness (generalized) (M62.81);Difficulty in walking, not elsewhere classified (R26.2)     Time: 1305-1330 PT Time Calculation (min) (ACUTE ONLY): 25 min  Charges:  $Gait Training: 8-22 mins $Therapeutic Exercise: 8-22 mins                     Rica Koyanagi  PTA Acute  Rehabilitation Services Pager      [phone] Office      [phone]

## 2020-04-25 NOTE — Discharge Summary (Signed)
Patient ID: Molly Wilson MRN: [id number] DOB/AGE: Jun 24, 1951 69 y.o.  Admit date: 04/24/2020 Discharge date: 04/25/2020  Admission Diagnoses:  Principal Problem:   Osteoarthritis of left knee Active Problems:   Status post total left knee replacement   Discharge Diagnoses:  Same  Past Medical History:  Diagnosis Date   Arthritis    Cancer ([place])    colorectal cancer   Hypothyroidism    Rheumatoid aortitis     Surgeries: Procedure(s):  LEFT TOTAL KNEE ARTHROPLASTY on 04/24/2020   Consultants:   N/A  Discharged Condition: Improved  Hospital Course: Molly Wilson is an 69 y.o. female who was admitted 04/24/2020 for operative treatment ofOsteoarthritis of left knee. Patient has severe unremitting pain that affects sleep, daily activities, and work/hobbies. After pre-op clearance the patient was taken to the operating room on 04/24/2020 and underwent  Procedure(s):  LEFT TOTAL KNEE ARTHROPLASTY.    Patient was given perioperative antibiotics:  Anti-infectives (From admission, onward)   Start     Dose/Rate Route Frequency Ordered Stop   04/24/20 1600  ceFAZolin (ANCEF) IVPB 2g/100 mL premix        2 g 200 mL/hr over 30 Minutes Intravenous Every 6 hours 04/24/20 1424 04/24/20 2134   04/24/20 0800  ceFAZolin (ANCEF) IVPB 2g/100 mL premix        2 g 200 mL/hr over 30 Minutes Intravenous On call to O.R. 04/24/20 0751 04/24/20 1001       Patient was given sequential compression devices, early ambulation, and chemoprophylaxis to prevent DVT.  Patient benefited maximally from hospital stay and there were no complications.    Recent vital signs:  Patient Vitals for the past 24 hrs:  BP Temp Temp src Pulse Resp SpO2 Height Weight  04/25/20 0521 110/78 (!) 97.4 F (36.3 C) Oral 62 16 97 % -- --  04/25/20 0154 110/76 (!) 97.5 F (36.4 C) Oral 60 16 97 % -- --  04/24/20 2121 107/74 98.1 F (36.7 C) -- 69 17 97 % -- --  04/24/20 1725 123/70 -- -- 86 18 98 % -- --  04/24/20  1603 127/84 97.7 F (36.5 C) Oral 70 17 100 % -- --  04/24/20 1436 -- -- -- -- -- -- 5\' 6"  (1.676 m) 62.6 kg  04/24/20 1435 (!) 117/89 -- -- 63 15 100 % -- --  04/24/20 1400 122/80 -- -- 65 14 100 % -- --  04/24/20 1345 119/78 -- -- 62 19 100 % -- --  04/24/20 1330 120/78 -- -- 60 (!) 10 100 % -- --  04/24/20 1315 119/79 98.5 F (36.9 C) -- 58 13 100 % -- --  04/24/20 1300 112/76 -- -- 60 20 100 % -- --  04/24/20 1245 114/85 -- -- 65 16 100 % -- --  04/24/20 1230 103/74 -- -- 66 14 100 % -- --  04/24/20 1215 105/68 -- -- 66 21 100 % -- --  04/24/20 1200 (!) 100/58 -- -- 65 15 100 % -- --  04/24/20 1151 (!) 97/58 (!) 97.5 F (36.4 C) -- 63 16 100 % -- --  04/24/20 0920 -- -- -- 71 16 97 % -- --  04/24/20 0919 126/82 -- -- 72 16 95 % -- --  04/24/20 0918 -- -- -- 70 12 95 % -- --  04/24/20 0917 -- -- -- 76 12 99 % -- --  04/24/20 0916 -- -- -- 73 12 100 % -- --  04/24/20 0915 -- -- -- 71  16 99 % -- --  04/24/20 0914 (!) 143/85 -- -- 76 (!) 25 95 % -- --     Recent laboratory studies:  Recent Labs    04/25/20 0306  WBC 15.0*  HGB 10.8*  HCT 34.3*  PLT 213  NA 138  K 3.8  CL 107  CO2 24  BUN 15  CREATININE 0.59  GLUCOSE 174*  CALCIUM 8.5*     Discharge Medications:   Allergies as of 04/25/2020   No Known Allergies     Medication List    STOP taking these medications   acetaminophen 500 MG tablet Commonly known as: TYLENOL   Voltaren 1 % Gel Generic drug: diclofenac Sodium     TAKE these medications   aspirin 81 MG chewable tablet Commonly known as: Aspirin Childrens Chew 1 tablet (81 mg total) by mouth 2 (two) times daily. Take for 4 weeks, then resume regular dose.   azelastine 0.1 % nasal spray Commonly known as: ASTELIN Place 1 spray into both nostrils at bedtime. Use in each nostril as directed   Biotin 5000 MCG Tabs Take 5,000 mcg by mouth daily.   CALCIUM 600+D3 PLUS MINERALS PO Take 1 tablet by mouth daily with lunch.   CULTURELLE DIGESTIVE  DAILY PO Take 1 capsule by mouth daily.   docusate sodium 100 MG capsule Commonly known as: Colace Take 1 capsule (100 mg total) by mouth 2 (two) times daily.   ferrous sulfate 325 (65 FE) MG tablet Commonly known as: FerrouSul Take 1 tablet (325 mg total) by mouth 3 (three) times daily with meals for 14 days.   fluticasone 50 MCG/ACT nasal spray Commonly known as: FLONASE Place 2 sprays into both nostrils daily.   HYDROcodone-acetaminophen 7.5-325 MG tablet Commonly known as: Norco Take 1-2 tablets by mouth every 4 (four) hours as needed for moderate pain.   levothyroxine 100 MCG tablet Commonly known as: SYNTHROID Take 100 mcg by mouth daily before breakfast.   methocarbamol 500 MG tablet Commonly known as: Robaxin Take 1 tablet (500 mg total) by mouth every 6 (six) hours as needed for muscle spasms.   OMEGA-3 + VITAMIN D3 PO Take 1 capsule by mouth daily.   ondansetron 4 MG tablet Commonly known as: Zofran Take 1 tablet (4 mg total) by mouth every 8 (eight) hours as needed for nausea or vomiting.   polyethylene glycol 17 g packet Commonly known as: MIRALAX / GLYCOLAX Take 17 g by mouth 2 (two) times daily.   predniSONE 1 MG tablet Commonly known as: DELTASONE Take 2 mg by mouth daily. In the morning.   Refresh Optive Advanced 0.5-1-0.5 % Soln Generic drug: Carboxymeth-Glycerin-Polysorb Place 1 drop into both eyes daily.   Rheumate Caps Take 1 capsule by mouth daily after lunch.   Soolantra 1 % Crea Generic drug: Ivermectin Apply 1 application topically every other day as needed (skin irritation.).   Systane 0.4-0.3 % Gel ophthalmic gel Generic drug: Polyethyl Glycol-Propyl Glycol Place 1 application into both eyes at bedtime.   tretinoin 0.025 % cream Commonly known as: RETIN-A Apply 1 application topically every other day as needed (skin irritation.). Alternate every other night with soolantra cream   Vitamin K2 100 MCG Caps Take 100 mcg by mouth  daily.       Diagnostic Studies: No results found.  Disposition: Home     Follow-up Information    Paralee Cancel, MD. Schedule an appointment as soon as possible for a visit in 2 weeks.  Specialty: Orthopedic Surgery Contact information: [address] [place] [address] [phone]                Signed: Lucille Passy [facility] 04/25/2020, 8:51 AM

## 2020-04-25 NOTE — Progress Notes (Signed)
Physical Therapy Treatment Patient Details Name: Molly Wilson MRN: [id number] DOB: 09/20/1951 Today's Date: 04/25/2020    History of Present Illness Patient is 69 y.o. female s/p Lt TKA on 04/24/20 with PMH significant for RA, OA, colorectal cancer, hypothyroidism.    PT Comments    POD # 1 pm session Assisted with amb an increased distance with spouse "hands on" instructed on safe handling. General Gait Details: 25% VC's on proper walker to self distance and proper sequencing.   Spouse also instructed on safe handling.  Then returned to room to perform remaining TE's following HEP handout.  Instructed on proper tech, freq as well as use of ICE.  Addressed all mobility questions, discussed appropriate activity, educated on use of ICE.  Pt ready for D/C to home.     Follow Up Recommendations  Follow surgeon's recommendation for DC plan and follow-up therapies;Home health PT;Outpatient PT     Equipment Recommendations  Rolling walker with 5" wheels;3in1 (PT)    Recommendations for Other Services       Precautions / Restrictions Precautions Precautions: Fall Precaution Comments: instructed no pillow under knee Restrictions Weight Bearing Restrictions: No Other Position/Activity Restrictions: WBAT    Mobility     Transfers Overall transfer level: Needs assistance Equipment used: Rolling walker (2 wheeled) Transfers: Sit to/from [employer] Transfers Sit to Stand: Supervision Stand pivot transfers: Supervision       General transfer comment: 25% VC's on proper hand placement and safety with turns  Ambulation/Gait Ambulation/Gait assistance: Min guard;Min assist Gait Distance (Feet): 75 Feet Assistive device: Rolling walker (2 wheeled) Gait Pattern/deviations: Step-to pattern;Decreased stride length;Decreased weight shift to left Gait velocity: decreased   General Gait Details: 25% VC's on proper walker to self distance and proper sequencing.   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  5 reps all seated TE's following HEP handout    General Comments        Pertinent Vitals/Pain Pain Assessment: 0-10 Pain Score: 3  Pain Location: Lt knee Pain Descriptors / Indicators: Burning;Aching;Discomfort Pain Intervention(s): Monitored during session;Premedicated before session;Repositioned;Ice applied    Home Living                      Prior Function            PT Goals (current goals can now be found in the care plan section) Progress towards PT goals: Progressing toward goals    Frequency    7X/week      PT Plan Current plan remains appropriate    Co-evaluation              AM-PAC PT "  6 Clicks" Mobility   Outcome Measure  Help needed turning from your back to your side while in a flat bed without using bedrails?: None Help needed moving from lying on your back to sitting on the side of a flat bed without using bedrails?: A Little Help needed moving to and from a bed to a chair (including a wheelchair)?: A Little Help needed standing up from a chair using your arms (e.g., wheelchair or bedside chair)?: A Little Help needed to walk in hospital room?: A Little Help needed climbing 3-5 steps with a railing? : A Little 6 Click Score: 19    End of Session Equipment Utilized During Treatment: Gait belt Activity Tolerance: Patient tolerated treatment well Patient left: in chair;with call bell/phone within reach;with  chair alarm set;with family/visitor present Nurse Communication: Mobility status (pt will need a second PT session to complete HEP education) PT Visit Diagnosis: Muscle weakness (generalized) (M62.81);Difficulty in walking, not elsewhere classified (R26.2)     Time: 1305-1330 PT Time Calculation (min) (ACUTE ONLY): 25 min  Charges:  $Gait Training: 8-22 mins $Therapeutic Exercise: 8-22 mins                     Rica Koyanagi  PTA Acute  Rehabilitation Services Pager      [phone] Office      [phone]

## 2020-04-25 NOTE — Plan of Care (Signed)
Patient was discharged in stable condition

## 2020-04-26 ENCOUNTER — Encounter (HOSPITAL_COMMUNITY): Payer: Self-pay | Admitting: Orthopedic Surgery

## 2020-05-22 ENCOUNTER — Ambulatory Visit
Admission: RE | Admit: 2020-05-22 | Discharge: 2020-05-22 | Disposition: A | Discharge status: Home / Self Care | Payer: No Typology Code available for payment source | Source: Ambulatory Visit | Attending: Obstetrics and Gynecology | Admitting: Obstetrics and Gynecology

## 2020-05-22 ENCOUNTER — Other Ambulatory Visit: Payer: Self-pay

## 2020-05-22 DIAGNOSIS — Z803 Family history of malignant neoplasm of breast: Secondary | ICD-10-CM

## 2020-05-22 MED ORDER — GADOBUTROL 1 MMOL/ML IV SOLN
6.0000 mL | Freq: Once | INTRAVENOUS | Status: AC | PRN
  Administered 2020-05-22: 6 mL via INTRAVENOUS

## 2021-06-13 IMAGING — MR MR BREAST WO/W CM  BILAT
5 series · 30 of 48 positions shown · IV contrast (6 ml gadavist)
Comparison: Screening mammogram on 11/01/2019

CLINICAL DATA: Abbreviated Breast MRI for breast cancer screening.

Personal history of colorectal cancer diagnosed in 2441. Patient's
mother was diagnosed with breast cancer at age 70.
LABS:  None obtained at the time of imaging.
EXAM:
BILATERAL ABBREVIATED BREAST MRI WITH AND WITHOUT CONTRAST
TECHNIQUE: Multiplanar, multisequence MR images of both breasts were obtained
prior to and following the intravenous administration of 6 ml of
Gadavist

[Series 2: t2_tirm_tra ipat (a-p) · axial · 3.0mm · 0.70mm/px · z∈[-89,+94]mm · 5 of 62 slices shown]
[im 1/62]
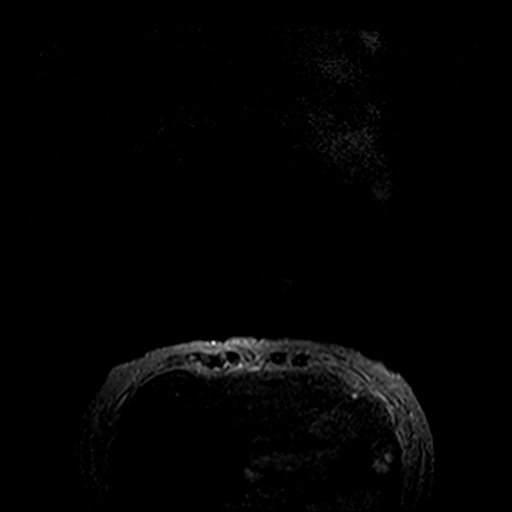
[im 16/62]
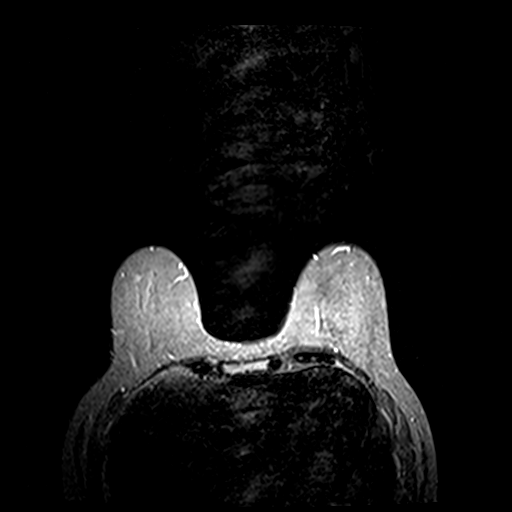
[im 31/62]
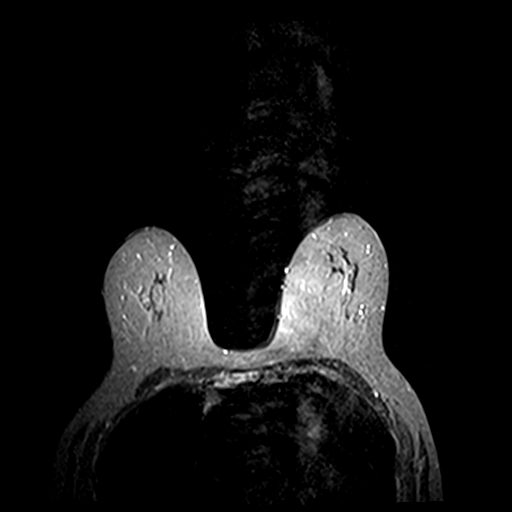
[im 46/62]
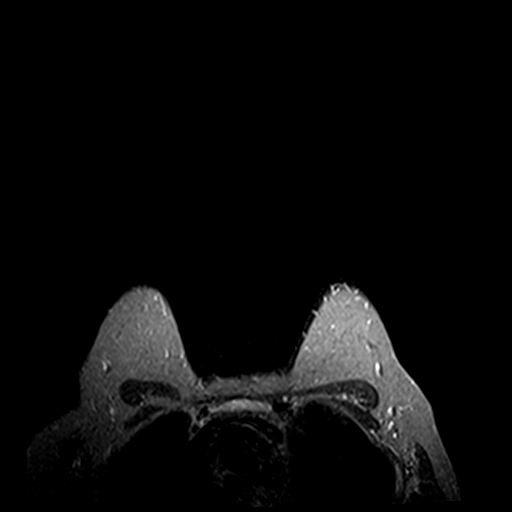
[im 62/62]
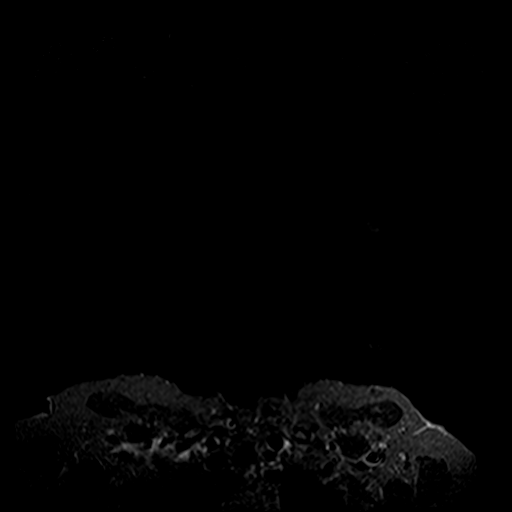

[Series 3: fl3d pre-cm · axial · non-contrast · 1.2mm · 0.94mm/px · z∈[-93,+98]mm · 8 of 160 slices shown]
[im 1/160]
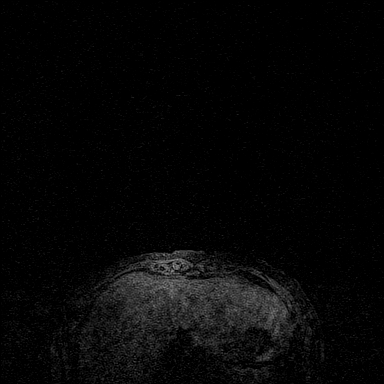
[im 25/160]
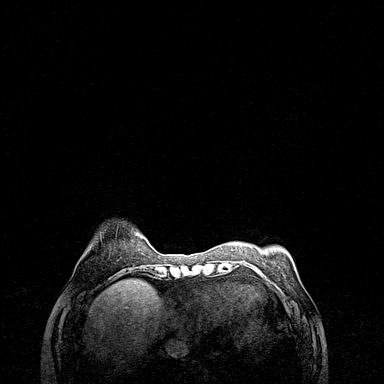
[im 49/160]
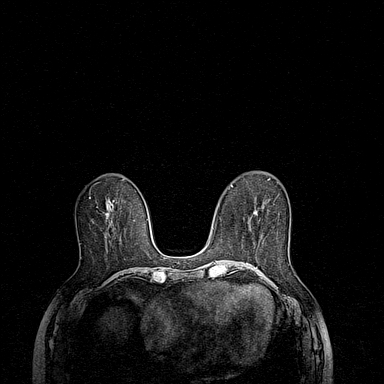
[im 74/160]
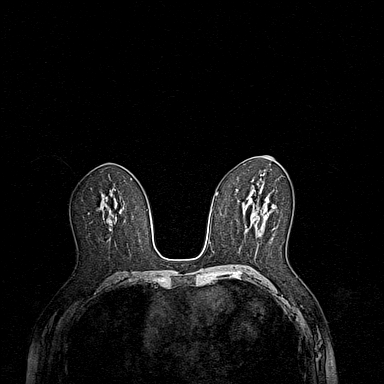
[im 86/160]
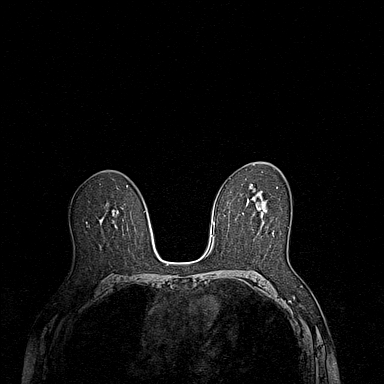
[im 111/160]
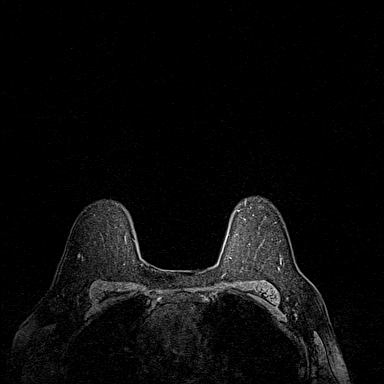
[im 135/160]
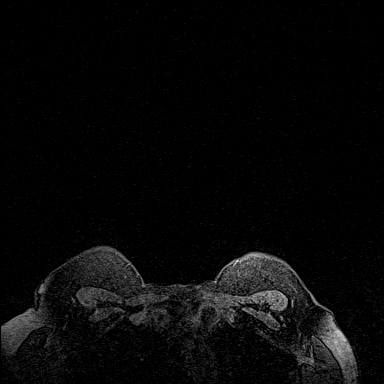
[im 160/160]
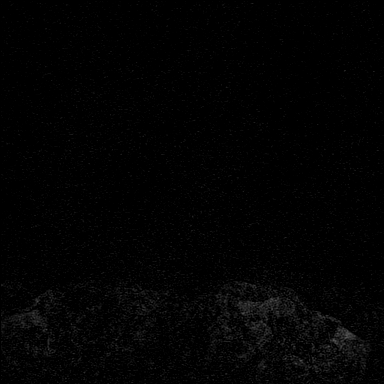

[Series 4: fl3d post-cm 20 · axial · 1.2mm · 0.94mm/px · z∈[-93,+98]mm · 8 of 160 slices shown (1 of 3)]
[im 1/160]
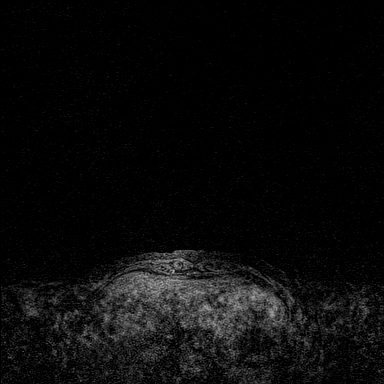
[im 25/160]
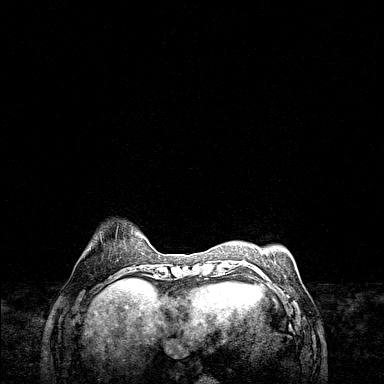
[im 49/160]
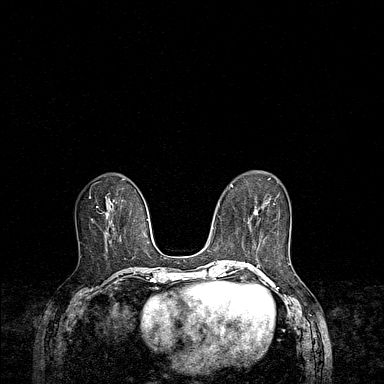
[im 74/160]
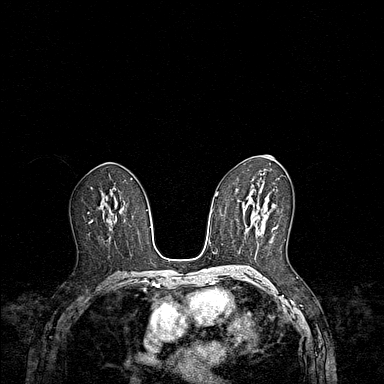
[im 86/160]
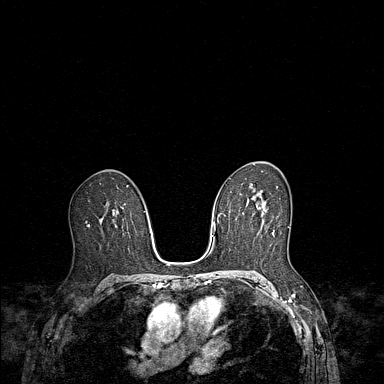
[im 111/160]
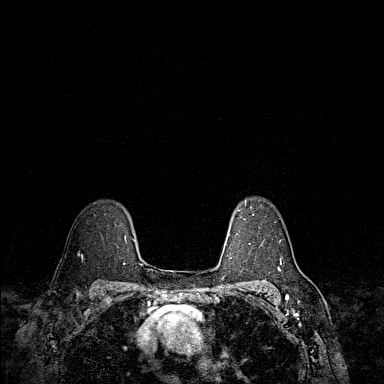
[im 135/160]
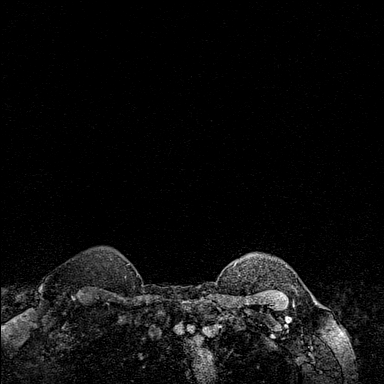
[im 160/160]
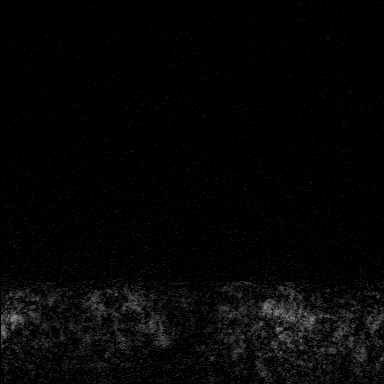

[Series 5: fl3d post-cm 20 · axial · 1.2mm · 0.94mm/px · z∈[-93,+98]mm · 8 of 160 slices shown (2 of 3)]
[im 1/160]
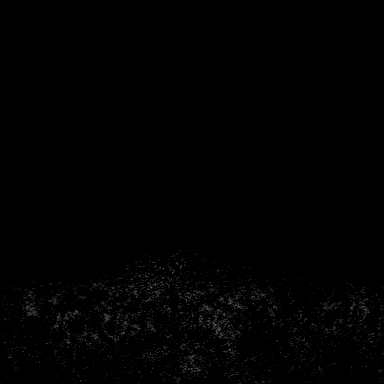
[im 25/160]
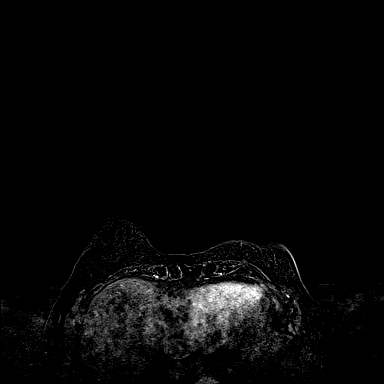
[im 49/160]
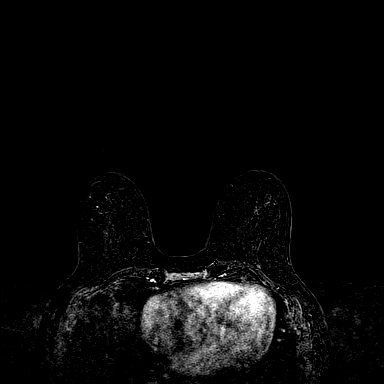
[im 74/160]
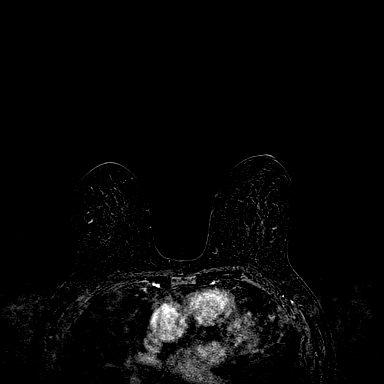
[im 86/160]
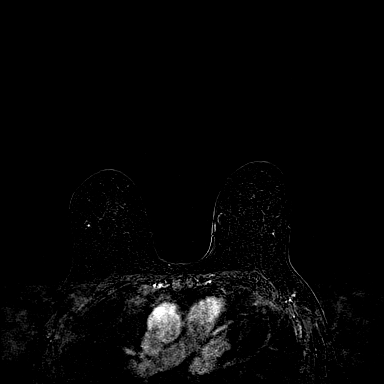
[im 111/160]
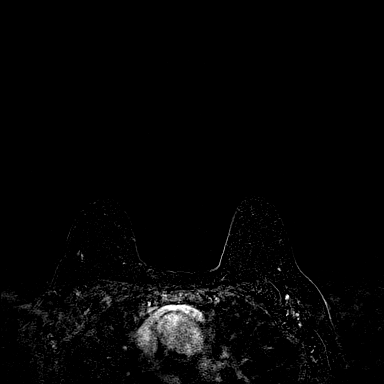
[im 135/160]
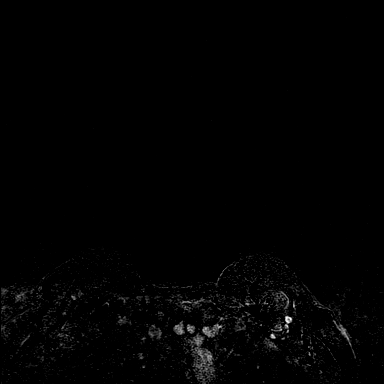
[im 160/160]
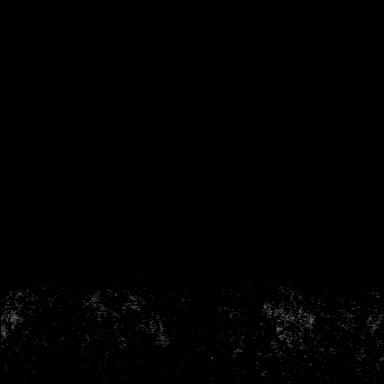

[Series 6: fl3d post-cm 20 · axial · 192.0mm · 0.94mm/px · 1 of 1 slices shown (3 of 3)]
[im 1/1]
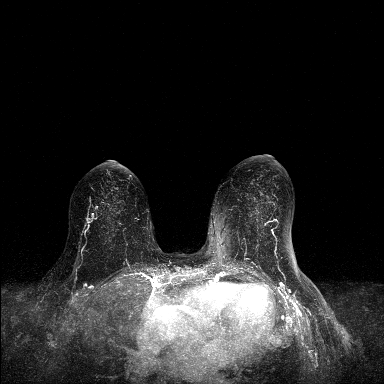

[30 of 48 positions shown; findings below may reference images not displayed]

Three-dimensional MR images were rendered by post-processing of the
original MR data on an independent workstation. The
three-dimensional MR images were interpreted, and findings are
reported in the following complete MRI report for this study. Three
dimensional images were evaluated at the independent [employer]
workstation.
FINDINGS: Breast composition: b. Scattered fibroglandular tissue.

Background parenchymal enhancement: Minimal

Right breast: No mass or abnormal enhancement.

Left breast: No mass or abnormal enhancement.

Lymph nodes: No abnormal appearing lymph nodes.

Ancillary findings: Note is made of a benign cyst within the dome of
the LEFT hepatic lobe, measuring 2.1 centimeters.

Breast composition: b. Scattered fibroglandular tissue.
IMPRESSION: No MRI evidence for malignancy.

RECOMMENDATION:
Recommend screening mammogram in October 2020.

The patient is also eligible for annual Abbreviated Breast MRI if
she wishes

BI-RADS CATEGORY  1: Negative.
